# Patient Record
Sex: Female | Born: 1990 | Race: White | Hispanic: No | Smoking: Former smoker
Health system: Southern US, Community
[De-identification: ages and names within clinical notes are randomized; demographics above are authoritative.]

## PROBLEM LIST (undated history)

## (undated) DIAGNOSIS — F419 Anxiety disorder, unspecified: Secondary | ICD-10-CM

## (undated) DIAGNOSIS — F410 Panic disorder [episodic paroxysmal anxiety] without agoraphobia: Secondary | ICD-10-CM

## (undated) DIAGNOSIS — F4310 Post-traumatic stress disorder, unspecified: Secondary | ICD-10-CM

## (undated) DIAGNOSIS — T07XXXA Unspecified multiple injuries, initial encounter: Secondary | ICD-10-CM

## (undated) DIAGNOSIS — F319 Bipolar disorder, unspecified: Secondary | ICD-10-CM

## (undated) DIAGNOSIS — Z87442 Personal history of urinary calculi: Secondary | ICD-10-CM

## (undated) DIAGNOSIS — F6381 Intermittent explosive disorder: Secondary | ICD-10-CM

## (undated) HISTORY — PX: DILATION AND CURETTAGE OF UTERUS: SHX78

## (undated) HISTORY — PX: BREAST ENHANCEMENT SURGERY: SHX7

---
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
## 2009-06-08 ENCOUNTER — Emergency Department (HOSPITAL_COMMUNITY): Admission: EM | Admit: 2009-06-08 | Discharge: 2009-06-08 | Payer: Self-pay | Admitting: Emergency Medicine

## 2009-12-05 ENCOUNTER — Emergency Department (HOSPITAL_BASED_OUTPATIENT_CLINIC_OR_DEPARTMENT_OTHER): Admission: EM | Admit: 2009-12-05 | Discharge: 2009-12-06 | Payer: Self-pay | Admitting: Emergency Medicine

## 2009-12-06 ENCOUNTER — Ambulatory Visit: Payer: Self-pay | Admitting: Diagnostic Radiology

## 2010-01-13 ENCOUNTER — Emergency Department (HOSPITAL_COMMUNITY): Admission: EM | Admit: 2010-01-13 | Discharge: 2010-01-13 | Payer: Self-pay | Admitting: Emergency Medicine

## 2010-03-31 ENCOUNTER — Emergency Department (HOSPITAL_BASED_OUTPATIENT_CLINIC_OR_DEPARTMENT_OTHER): Admission: EM | Admit: 2010-03-31 | Discharge: 2010-03-31 | Payer: Self-pay | Admitting: Emergency Medicine

## 2010-03-31 ENCOUNTER — Ambulatory Visit: Payer: Self-pay | Admitting: Diagnostic Radiology

## 2010-06-06 ENCOUNTER — Emergency Department (HOSPITAL_BASED_OUTPATIENT_CLINIC_OR_DEPARTMENT_OTHER): Admission: EM | Admit: 2010-06-06 | Discharge: 2010-06-06 | Payer: Self-pay | Admitting: Emergency Medicine

## 2010-06-06 ENCOUNTER — Ambulatory Visit: Payer: Self-pay | Admitting: Diagnostic Radiology

## 2010-06-17 ENCOUNTER — Emergency Department (HOSPITAL_COMMUNITY)
Admission: EM | Admit: 2010-06-17 | Discharge: 2010-06-18 | Payer: Self-pay | Source: Home / Self Care | Admitting: Emergency Medicine

## 2010-07-26 ENCOUNTER — Emergency Department (HOSPITAL_COMMUNITY)
Admission: EM | Admit: 2010-07-26 | Discharge: 2010-07-26 | Payer: Self-pay | Source: Home / Self Care | Admitting: Emergency Medicine

## 2010-09-24 LAB — DIFFERENTIAL
Lymphocytes Relative: 39 % (ref 12–46)
Lymphs Abs: 2.3 10*3/uL (ref 0.7–4.0)
Monocytes Relative: 10 % (ref 3–12)
Neutro Abs: 3 10*3/uL (ref 1.7–7.7)
Neutrophils Relative %: 48 % (ref 43–77)

## 2010-09-24 LAB — BASIC METABOLIC PANEL
CO2: 27 mEq/L (ref 19–32)
Calcium: 9.5 mg/dL (ref 8.4–10.5)
GFR calc Af Amer: >60 mL/min (ref >60)
Sodium: 141 mEq/L (ref 135–145)

## 2010-09-24 LAB — CBC
Hemoglobin: 12 g/dL (ref 12.0–15.0)
MCH: 30.6 pg (ref 26.0–34.0)
RBC: 3.92 MIL/uL (ref 3.87–5.11)

## 2010-09-24 LAB — POCT CARDIAC MARKERS
CKMB, poc: <1 ng/mL — ABNORMAL LOW (ref 1.0–8.0)
Myoglobin, poc: 15.7 ng/mL (ref 12–200)
Troponin i, poc: <0.05 ng/mL (ref 0.00–0.09)

## 2010-09-27 LAB — POCT PREGNANCY, URINE: Preg Test, Ur: NEGATIVE

## 2010-09-28 LAB — BASIC METABOLIC PANEL
GFR calc non Af Amer: >60 mL/min (ref >60)
Potassium: 3.7 mEq/L (ref 3.5–5.1)
Sodium: 144 mEq/L (ref 135–145)

## 2010-09-28 LAB — POCT TOXICOLOGY PANEL: Tetrahydrocannabinol: POSITIVE

## 2010-09-28 LAB — PREGNANCY, URINE: Preg Test, Ur: NEGATIVE

## 2010-10-12 ENCOUNTER — Emergency Department (HOSPITAL_BASED_OUTPATIENT_CLINIC_OR_DEPARTMENT_OTHER)
Admission: EM | Admit: 2010-10-12 | Discharge: 2010-10-12 | Disposition: A | Discharge status: Home / Self Care | Payer: Medicaid Other | Attending: Emergency Medicine | Admitting: Emergency Medicine

## 2010-10-12 DIAGNOSIS — F341 Dysthymic disorder: Secondary | ICD-10-CM | POA: Insufficient documentation

## 2010-10-12 DIAGNOSIS — M79609 Pain in unspecified limb: Secondary | ICD-10-CM | POA: Insufficient documentation

## 2010-10-14 LAB — RAPID STREP SCREEN (MED CTR MEBANE ONLY): Streptococcus, Group A Screen (Direct): NEGATIVE

## 2010-11-21 ENCOUNTER — Emergency Department (HOSPITAL_BASED_OUTPATIENT_CLINIC_OR_DEPARTMENT_OTHER)
Admission: EM | Admit: 2010-11-21 | Discharge: 2010-11-21 | Disposition: A | Discharge status: Home / Self Care | Payer: Medicaid Other | Attending: Emergency Medicine | Admitting: Emergency Medicine

## 2010-11-21 DIAGNOSIS — J029 Acute pharyngitis, unspecified: Secondary | ICD-10-CM | POA: Insufficient documentation
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
## 2010-11-27 NOTE — Discharge Summary (Signed)
NAMEMarland Kitchen  Lydia Cochran, Lydia Cochran NO.:  [id number]   MEDICAL RECORD NO.:  [id number]         PATIENT TYPE:  BINP   LOCATION:  0302                          FACILITY:  [facility]   PHYSICIAN:  Jasmine Pang, M.D. DATE OF BIRTH:  07-10-1991   DATE OF ADMISSION:  12/12/2008  DATE OF DISCHARGE:  12/15/2008                               DISCHARGE SUMMARY   IDENTIFICATION:  This is an 20-1/21-year-old single white female, who  completed the 11th grade at [employer] and drop  down.   HISTORY OF PRESENT ILLNESS:  The patient was admitted emergently,  voluntarily from access and the intake crisis act [facility].  The patient reported a suicide plan, to cut herself, walk into  truck or in traffic, or shoot herself with father's gun.  She also  reported hating and wanting to harm everyone equally as a homicide  equivalent.  Her boyfriend had broken up with her the day before and  mother had evicted her 1 week before.  The patient is apparently  currently staying with her father.  The patient is due in court on December 18, 2008, being on probation for possession of paraphernalia and  cannabis.  She reports her last use of cannabis was 1 week ago.  Her  last alcohol was December 10, 2008.  For further information, see psychiatric  admission assessment.   PHYSICAL FINDINGS:  There were no acute physical or medical problems  noted.   HOSPITAL COURSE:  Upon admission, the patient was started on trazodone  50 mg p.o. q.h.s., p.r.n. insomnia may repeat x1, Seroquel 100 mg p.o.  x1  now, and Ativan 2 mg p.o. x1 due to severe agitation.  On December 13, 2008, trazodone was discontinued, Ativan was discontinued, with that she  was placed on Seroquel 25 mg p.o. q.4 h. p.r.n. agitation.  She was also  started on their 50 mg p.o. q.h.s.  On December 13, 2008, due to severe  agitation she was given Geodon 40 mg and Ativan 2 mg and restarted on  Ativan 1 mg to 2 mg p.o. q.4 h.,  p.r.n. anxiety or agitation.  She had  to be in an individual room due to her acting out behaviors.  There was  some anxiety and she could was started on Neurontin 300 mg q.4 hours,  p.r.n. anxiety.  She continued to be irritable and angry, when I met  with her, she was lying in bed with minimal eye contact.  Speech was  soft and slow.  There was positive psychomotor retardation.  She was  focused on wanting to go home.  Sleep was poor and appetite was poor.  As hospitalization progressed, mood improved somewhat.  On December 15, 2008,  she was asking for discharge.  Parents met with the counselor, who  agreed to the discharge.  The patient had refused her medications during  the hospitalization.  She was not suicidal or homicidal.  There was no  evidence of psychosis or thought disorder.  It was felt the patient was  safe to go home today. In  her family session, she said she would pursue  counseling and possibly 12-step meetings.  She will also have followup  at [facility].  On January 07, 2009, the patient left after the family  session with her parents and patient affirmed understanding of the  followup plan.   DISCHARGE DIAGNOSES:  Axis I:  Mood disorder, not otherwise specified.  Generalized anxiety disorder, oppositional defiant disorder, cannabis  dependence, alcohol abuse.  Axis II:  None.  Axis III:  Chronic bronchitis.  Axis IV:  Stressors severe (family extreme acute and chronic, legal  moderate acute and chronic, schools severe, acute and chronic phase of  life, severe, acute and chronic, peer relations, severe, acute and  chronic).  Axis V:  Global assessment of functioning was 45 upon discharge.  GAF  was 20 upon admission.  GAF highest past year was 55.   DISCHARGE PLANS:  There was no specific activity level or dietary  restrictions.   POSTHOSPITAL CARE PLANS:  The patient will go to [facility] on January 07, 2009, at 11 a.m.  She will also go to [facility]  for  counseling.   DISCHARGE MEDICATIONS:  None.  The patient refused to take any of the  medications offered to her in the hospital and did not want to take any  medications at home.      Jasmine Pang, M.D.  Electronically Signed     BHS/MEDQ  D:  12/24/2008  T:  12/25/2008  Job:  [id number]

## 2011-06-17 ENCOUNTER — Encounter: Payer: Self-pay | Admitting: Emergency Medicine

## 2011-06-17 ENCOUNTER — Emergency Department (HOSPITAL_COMMUNITY)
Admission: EM | Admit: 2011-06-17 | Discharge: 2011-06-17 | Disposition: A | Discharge status: Home / Self Care | Payer: Self-pay | Attending: Emergency Medicine | Admitting: Emergency Medicine

## 2011-06-17 DIAGNOSIS — F6381 Intermittent explosive disorder: Secondary | ICD-10-CM | POA: Insufficient documentation

## 2011-06-17 DIAGNOSIS — Z79899 Other long term (current) drug therapy: Secondary | ICD-10-CM | POA: Insufficient documentation

## 2011-06-17 DIAGNOSIS — Z760 Encounter for issue of repeat prescription: Secondary | ICD-10-CM | POA: Insufficient documentation

## 2011-06-17 DIAGNOSIS — F410 Panic disorder [episodic paroxysmal anxiety] without agoraphobia: Secondary | ICD-10-CM | POA: Insufficient documentation

## 2011-06-17 DIAGNOSIS — F419 Anxiety disorder, unspecified: Secondary | ICD-10-CM | POA: Insufficient documentation

## 2011-06-17 HISTORY — DX: Intermittent explosive disorder: F63.81

## 2011-06-17 HISTORY — DX: Panic disorder (episodic paroxysmal anxiety): F41.0

## 2011-06-17 HISTORY — DX: Anxiety disorder, unspecified: F41.9

## 2011-06-17 MED ORDER — DIVALPROEX SODIUM ER 500 MG PO TB24: 500.0000 mg | ORAL_TABLET | Freq: Every day | ORAL | Status: DC

## 2011-06-17 MED ORDER — PROMETHAZINE HCL 25 MG PO TABS: 25.0000 mg | ORAL_TABLET | Freq: Four times a day (QID) | ORAL | Status: DC | PRN

## 2011-06-17 MED ORDER — CHLORPROMAZINE HCL 10 MG PO TABS: 50.0000 mg | ORAL_TABLET | Freq: Every day | ORAL | Status: DC

## 2011-06-17 NOTE — ED Notes (Signed)
Pt sts she was in jail for 4 months receiving her meds and now has been let out and has been without them for several days; pt sts feels like she is having withdrawal and is more agitated; pt sts call [facility] and had appt set up for 12/28 but were not able to help with meds

## 2011-06-17 NOTE — ED Provider Notes (Signed)
History     CSN: [id number] Arrival date & time: 06/17/2011 12:36 PM   First MD Initiated Contact with Patient 06/17/11 1301      Chief Complaint  Patient presents with  . Medication Refill    (Consider location/radiation/quality/duration/timing/severity/associated sxs/prior treatment) The history is provided by the patient.   patient is a 20 year old female recently released from jail and needs refill of her behavioral health metastases she went to the [facility] they were unable to read new the meds but did make an appointment for her on December 28 to see a psychiatrist who can renew her meds. Patient currently not having any acute behavioral health problem.   Past Medical History  Diagnosis Date  . Intermittent explosive disorder   . Anxiety   . Panic attack     History reviewed. No pertinent past surgical history.  History reviewed. No pertinent family history.  History  Substance Use Topics  . Smoking status: Current Everyday Smoker  . Smokeless tobacco: Not on file  . Alcohol Use: No    OB History    Grav Para Term Preterm Abortions TAB SAB Ect Mult Living                  Review of Systems  Constitutional: Negative for fever and diaphoresis.  HENT: Negative for congestion and neck pain.   Eyes: Negative for visual disturbance.  Respiratory: Negative for cough and shortness of breath.   Cardiovascular: Negative for chest pain.  Gastrointestinal: Negative for nausea, vomiting, abdominal pain and diarrhea.  Genitourinary: Negative for dysuria.  Musculoskeletal: Negative for back pain.  Neurological: Negative for headaches.    Allergies  Review of patient's allergies indicates no known allergies.  Home Medications   Current Outpatient Rx  Name Route Sig Dispense Refill  . ALPRAZOLAM 1 MG PO TABS Oral Take 1 mg by mouth 3 (three) times daily.      . CHLORPROMAZINE HCL 50 MG PO TABS Oral Take 50 mg by mouth 2 (two) times daily.      Marland Kitchen DIVALPROEX  SODIUM 500 MG PO TBEC Oral Take 1,000 mg by mouth 2 (two) times daily.      Marland Kitchen PROMETHAZINE HCL 25 MG PO TABS Oral Take 25 mg by mouth every 6 (six) hours as needed. For nausea     . CHLORPROMAZINE HCL 10 MG PO TABS Oral Take 5 tablets (50 mg total) by mouth daily. 30 tablet 0  . DIVALPROEX SODIUM ER 500 MG PO TB24 Oral Take 1 tablet (500 mg total) by mouth daily. 30 tablet 0  . PROMETHAZINE HCL 25 MG PO TABS Oral Take 1 tablet (25 mg total) by mouth every 6 (six) hours as needed for nausea. 20 tablet 0    BP 94/57  Pulse 72  Temp(Src) 97.8 F (36.6 C) (Oral)  Resp 20  SpO2 100%  Physical Exam  Nursing note and vitals reviewed. Constitutional: She is oriented to person, place, and time. She appears well-developed and well-nourished. No distress.  HENT:  Head: Normocephalic and atraumatic.  Mouth/Throat: Oropharynx is clear and moist.  Eyes: Conjunctivae and EOM are normal. Pupils are equal, round, and reactive to light.  Neck: Normal range of motion. Neck supple.  Cardiovascular: Normal rate, regular rhythm and normal heart sounds.   Pulmonary/Chest: Effort normal and breath sounds normal.  Abdominal: Soft. Bowel sounds are normal.  Musculoskeletal: Normal range of motion.  Neurological: She is alert and oriented to person, place, and time. No cranial nerve  deficit. She exhibits normal muscle tone. Coordination normal.  Skin: Skin is warm. No rash noted.    ED Course  Procedures (including critical care time)  Labs Reviewed - No data to display No results found.   1. Medication refill       MDM   The patient with history of intermittent explosive disorder anxiety and panic attacks presents to the emergency department for refill of her behavioral health medication. She is oriented and seen at the walk-in [facility], and given an appointment on December 28 to see the psychiatrist there for further medication renewals. We will renew her medications today with the  exception of the Xanax. Renewed her Thorazine Depakote and Phenergan. Patient has been sleeping on the ED bed since arrival and had to be awoken for the exam, she is showing no signs of anxiety or significant withdrawals.        Shelda Jakes, MD 06/17/11 1415

## 2012-07-25 ENCOUNTER — Encounter (HOSPITAL_COMMUNITY): Payer: Self-pay | Admitting: *Deleted

## 2012-07-25 ENCOUNTER — Emergency Department (HOSPITAL_COMMUNITY)
Admission: EM | Admit: 2012-07-25 | Discharge: 2012-07-25 | Discharge status: Against Medical Advice | Payer: Self-pay | Attending: Emergency Medicine | Admitting: Emergency Medicine

## 2012-07-25 DIAGNOSIS — R109 Unspecified abdominal pain: Secondary | ICD-10-CM | POA: Insufficient documentation

## 2012-07-25 NOTE — ED Notes (Signed)
Pt did not answer x's 3 when called for blood draw.

## 2012-07-25 NOTE — ED Notes (Signed)
Pt states started having L flank pain today, family hx of kidney stones, denies urinary symptoms, states is on her menstrual cycle but this is not like cramps.

## 2012-07-25 NOTE — ED Notes (Signed)
Did not answer when called to room.

## 2012-07-26 ENCOUNTER — Emergency Department (HOSPITAL_COMMUNITY)
Admission: EM | Admit: 2012-07-26 | Discharge: 2012-07-26 | Disposition: A | Discharge status: Home / Self Care | Payer: Self-pay | Attending: Emergency Medicine | Admitting: Emergency Medicine

## 2012-07-26 ENCOUNTER — Encounter (HOSPITAL_COMMUNITY): Payer: Self-pay | Admitting: Emergency Medicine

## 2012-07-26 ENCOUNTER — Emergency Department (HOSPITAL_COMMUNITY): Payer: Self-pay

## 2012-07-26 DIAGNOSIS — F17200 Nicotine dependence, unspecified, uncomplicated: Secondary | ICD-10-CM | POA: Insufficient documentation

## 2012-07-26 DIAGNOSIS — F411 Generalized anxiety disorder: Secondary | ICD-10-CM | POA: Insufficient documentation

## 2012-07-26 DIAGNOSIS — Z3202 Encounter for pregnancy test, result negative: Secondary | ICD-10-CM | POA: Insufficient documentation

## 2012-07-26 DIAGNOSIS — F6381 Intermittent explosive disorder: Secondary | ICD-10-CM | POA: Insufficient documentation

## 2012-07-26 DIAGNOSIS — Z8742 Personal history of other diseases of the female genital tract: Secondary | ICD-10-CM | POA: Insufficient documentation

## 2012-07-26 DIAGNOSIS — R112 Nausea with vomiting, unspecified: Secondary | ICD-10-CM | POA: Insufficient documentation

## 2012-07-26 DIAGNOSIS — R109 Unspecified abdominal pain: Secondary | ICD-10-CM | POA: Insufficient documentation

## 2012-07-26 LAB — CBC WITH DIFFERENTIAL/PLATELET
Basophils Absolute: 0 10*3/uL (ref 0.0–0.1)
Basophils Relative: 0 % (ref 0–1)
Eosinophils Absolute: 0 10*3/uL (ref 0.0–0.7)
Eosinophils Relative: 0 % (ref 0–5)
HCT: 36.6 % (ref 36.0–46.0)
Hemoglobin: 12.7 g/dL (ref 12.0–15.0)
Lymphocytes Relative: 5 % — ABNORMAL LOW (ref 12–46)
Lymphs Abs: 0.8 10*3/uL (ref 0.7–4.0)
MCH: 28.6 pg (ref 26.0–34.0)
MCHC: 34.7 g/dL (ref 30.0–36.0)
MCV: 82.4 fL (ref 78.0–100.0)
Monocytes Absolute: 1.6 10*3/uL — ABNORMAL HIGH (ref 0.1–1.0)
Monocytes Relative: 9 % (ref 3–12)
Neutro Abs: 14.7 10*3/uL — ABNORMAL HIGH (ref 1.7–7.7)
Neutrophils Relative %: 86 % — ABNORMAL HIGH (ref 43–77)
Platelets: 191 10*3/uL (ref 150–400)
RBC: 4.44 MIL/uL (ref 3.87–5.11)
RDW: 14.3 % (ref 11.5–15.5)
WBC: 17.1 10*3/uL — ABNORMAL HIGH (ref 4.0–10.5)

## 2012-07-26 LAB — URINALYSIS, MICROSCOPIC ONLY
Bilirubin Urine: NEGATIVE
Glucose, UA: NEGATIVE mg/dL
Ketones, ur: 15 mg/dL — AB
Leukocytes, UA: NEGATIVE
Nitrite: NEGATIVE
Protein, ur: 30 mg/dL — AB
Specific Gravity, Urine: 1.027 (ref 1.005–1.030)
Urobilinogen, UA: 1 mg/dL (ref 0.0–1.0)
pH: 6 (ref 5.0–8.0)

## 2012-07-26 LAB — COMPREHENSIVE METABOLIC PANEL
ALT: 11 U/L (ref 0–35)
AST: 15 U/L (ref 0–37)
Albumin: 4.1 g/dL (ref 3.5–5.2)
Alkaline Phosphatase: 65 U/L (ref 39–117)
BUN: 7 mg/dL (ref 6–23)
CO2: 26 mEq/L (ref 19–32)
Calcium: 9.4 mg/dL (ref 8.4–10.5)
Chloride: 92 mEq/L — ABNORMAL LOW (ref 96–112)
Creatinine, Ser: 0.49 mg/dL — ABNORMAL LOW (ref 0.50–1.10)
GFR calc Af Amer: >90 mL/min (ref >90)
GFR calc non Af Amer: >90 mL/min (ref >90)
Glucose, Bld: 117 mg/dL — ABNORMAL HIGH (ref 70–99)
Potassium: 3 mEq/L — ABNORMAL LOW (ref 3.5–5.1)
Sodium: 132 mEq/L — ABNORMAL LOW (ref 135–145)
Total Bilirubin: 0.5 mg/dL (ref 0.3–1.2)
Total Protein: 7.8 g/dL (ref 6.0–8.3)

## 2012-07-26 LAB — POCT PREGNANCY, URINE: Preg Test, Ur: NEGATIVE

## 2012-07-26 LAB — LIPASE, BLOOD: Lipase: 7 U/L — ABNORMAL LOW (ref 11–59)

## 2012-07-26 MED ORDER — SODIUM CHLORIDE 0.9 % IV SOLN
1000.0000 mL | Freq: Once | INTRAVENOUS | Status: AC
  Administered 2012-07-26: 1000 mL via INTRAVENOUS

## 2012-07-26 MED ORDER — POTASSIUM CHLORIDE CRYS ER 20 MEQ PO TBCR
60.0000 meq | EXTENDED_RELEASE_TABLET | Freq: Once | ORAL | Status: AC
  Administered 2012-07-26: 60 meq via ORAL
  Filled 2012-07-26: qty 3

## 2012-07-26 MED ORDER — HYDROMORPHONE HCL PF 1 MG/ML IJ SOLN
1.0000 mg | Freq: Once | INTRAMUSCULAR | Status: AC
  Administered 2012-07-26: 1 mg via INTRAVENOUS
  Filled 2012-07-26: qty 1

## 2012-07-26 MED ORDER — OXYCODONE-ACETAMINOPHEN 5-325 MG PO TABS: 1.0000 | ORAL_TABLET | ORAL | Status: DC | PRN

## 2012-07-26 MED ORDER — ONDANSETRON HCL 4 MG PO TABS: 4.0000 mg | ORAL_TABLET | Freq: Four times a day (QID) | ORAL | Status: DC

## 2012-07-26 MED ORDER — POTASSIUM CHLORIDE CRYS ER 20 MEQ PO TBCR: 40.0000 meq | EXTENDED_RELEASE_TABLET | Freq: Once | ORAL | Status: DC

## 2012-07-26 MED ORDER — KETOROLAC TROMETHAMINE 15 MG/ML IJ SOLN
15.0000 mg | Freq: Once | INTRAMUSCULAR | Status: AC
  Administered 2012-07-26: 15 mg via INTRAVENOUS
  Filled 2012-07-26 (×2): qty 1

## 2012-07-26 MED ORDER — ONDANSETRON HCL 4 MG/2ML IJ SOLN: 4.0000 mg | Freq: Once | INTRAMUSCULAR | Status: DC

## 2012-07-26 MED ORDER — ONDANSETRON HCL 4 MG/2ML IJ SOLN
4.0000 mg | Freq: Once | INTRAMUSCULAR | Status: AC
  Administered 2012-07-26: 4 mg via INTRAVENOUS
  Filled 2012-07-26: qty 2

## 2012-07-26 NOTE — ED Notes (Signed)
Pt presenting to ed with c/o abdominal pain with positive nausea and vomiting pt denies diarrhea at this time

## 2012-07-26 NOTE — ED Notes (Signed)
Patient transported to CT 

## 2012-07-26 NOTE — ED Notes (Signed)
Pt escorted to discharge window. Verbalized understanding discharge instructions. In no acute distress.   

## 2012-07-26 NOTE — ED Provider Notes (Signed)
History    22 year old female with left flank pain. Onset yesterday. Atraumatic. Pain is the left flank it does not radiate. Constant without appreciable exacerbating relieving factors. No fever or chills. Nausea and has, the pain is severe. No urinary complaints. No unusual vaginal bleeding or discharge. She just finished her period. She does not think she is pregnant. No history of similar pain. She says she has had ovarian cyst previously this pain feels different and is higher than it was at that time. No history of kidney stones. No diarrhea. No sick contacts.   CSN: [id number]  Arrival date & time 07/26/12  1238   First MD Initiated Contact with Patient 07/26/12 1254      Chief Complaint  Patient presents with  . Abdominal Pain  . Nausea  . Emesis    (Consider location/radiation/quality/duration/timing/severity/associated sxs/prior treatment) HPI  Past Medical History  Diagnosis Date  . Intermittent explosive disorder   . Anxiety   . Panic attack     History reviewed. No pertinent past surgical history.  No family history on file.  History  Substance Use Topics  . Smoking status: Current Every Day Smoker  . Smokeless tobacco: Not on file  . Alcohol Use: No    OB History    Grav Para Term Preterm Abortions TAB SAB Ect Mult Living                  Review of Systems  All systems reviewed and negative, other than as noted in HPI.   Allergies  Morphine and related; Darvocet; and Tramadol  Home Medications  No current outpatient prescriptions on file.  BP 105/62  Pulse 63  Temp 98.4 F (36.9 C) (Oral)  Resp 17  SpO2 98%  LMP 07/18/2012  Physical Exam  Nursing note and vitals reviewed. Constitutional: She appears well-developed and well-nourished. No distress.       Laying in bed. Mildly uncomfortable appearing.  HENT:  Head: Normocephalic and atraumatic.  Eyes: Conjunctivae normal are normal. Right eye exhibits no discharge. Left eye exhibits no  discharge.  Neck: Neck supple.  Cardiovascular: Normal rate, regular rhythm and normal heart sounds.  Exam reveals no gallop and no friction rub.   No murmur heard. Pulmonary/Chest: Effort normal and breath sounds normal. No respiratory distress.  Abdominal: Soft. She exhibits no distension and no mass. There is no tenderness. There is no rebound.  Genitourinary:       No costovertebral angle tenderness  Musculoskeletal: She exhibits no edema and no tenderness.  Neurological: She is alert.  Skin: Skin is warm and dry.  Psychiatric: She has a normal mood and affect. Her behavior is normal. Thought content normal.    ED Course  Procedures (including critical care time)  Labs Reviewed  CBC WITH DIFFERENTIAL - Abnormal; Notable for the following:    WBC 17.1 (*)     Neutrophils Relative 86 (*)     Neutro Abs 14.7 (*)     Lymphocytes Relative 5 (*)     Monocytes Absolute 1.6 (*)     All other components within normal limits  COMPREHENSIVE METABOLIC PANEL - Abnormal; Notable for the following:    Sodium 132 (*)     Potassium 3.0 (*)     Chloride 92 (*)     Glucose, Bld 117 (*)     Creatinine, Ser 0.49 (*)     All other components within normal limits  LIPASE, BLOOD - Abnormal; Notable for the following:  Lipase 7 (*)     All other components within normal limits  URINALYSIS, MICROSCOPIC ONLY - Abnormal; Notable for the following:    Color, Urine AMBER (*)  BIOCHEMICALS MAY BE AFFECTED BY COLOR   APPearance CLOUDY (*)     Hgb urine dipstick SMALL (*)     Ketones, ur 15 (*)     Protein, ur 30 (*)     Bacteria, UA FEW (*)     Squamous Epithelial / LPF FEW (*)     All other components within normal limits  POCT PREGNANCY, URINE   No results found.   1. Left flank pain   2. Nausea and vomiting       MDM  22 year old female with abdominal pain. She is a relatively benign abdominal exam. CT was done to evaluate for possible ureteral colic and pain in her left flank. Her  CT was unremarkable. Her pain may be related to atelectasis or vomiting. She has not had any vomiting here in the emergency room. She tolerated several large potassium pills without any problems. She's afebrile and hemodynamically stable. Very low suspicion for emergent etiology. I feel that she is safe for discharge at this time. Will continue to treat symptomatically. Emergent return cautions were discussed.        Raeford Razor, MD 07/26/12 [phone]

## 2012-11-16 ENCOUNTER — Encounter (HOSPITAL_COMMUNITY): Payer: Self-pay | Admitting: Emergency Medicine

## 2012-11-16 ENCOUNTER — Emergency Department (HOSPITAL_COMMUNITY): Payer: Self-pay

## 2012-11-16 ENCOUNTER — Inpatient Hospital Stay (HOSPITAL_COMMUNITY): Admission: EM | Admit: 2012-11-16 | Payer: 59 | Source: Intra-hospital | Admitting: Psychiatry

## 2012-11-16 ENCOUNTER — Emergency Department (HOSPITAL_COMMUNITY)
Admission: EM | Admit: 2012-11-16 | Discharge: 2012-11-17 | Disposition: A | Discharge status: Home / Self Care | Payer: Self-pay | Attending: Emergency Medicine | Admitting: Emergency Medicine

## 2012-11-16 DIAGNOSIS — Z008 Encounter for other general examination: Secondary | ICD-10-CM | POA: Insufficient documentation

## 2012-11-16 DIAGNOSIS — S71109A Unspecified open wound, unspecified thigh, initial encounter: Secondary | ICD-10-CM | POA: Insufficient documentation

## 2012-11-16 DIAGNOSIS — F609 Personality disorder, unspecified: Secondary | ICD-10-CM

## 2012-11-16 DIAGNOSIS — F419 Anxiety disorder, unspecified: Secondary | ICD-10-CM

## 2012-11-16 DIAGNOSIS — F6381 Intermittent explosive disorder: Secondary | ICD-10-CM

## 2012-11-16 DIAGNOSIS — F17200 Nicotine dependence, unspecified, uncomplicated: Secondary | ICD-10-CM | POA: Insufficient documentation

## 2012-11-16 DIAGNOSIS — Z8659 Personal history of other mental and behavioral disorders: Secondary | ICD-10-CM | POA: Insufficient documentation

## 2012-11-16 DIAGNOSIS — F339 Major depressive disorder, recurrent, unspecified: Secondary | ICD-10-CM

## 2012-11-16 DIAGNOSIS — F1210 Cannabis abuse, uncomplicated: Secondary | ICD-10-CM

## 2012-11-16 DIAGNOSIS — S71009A Unspecified open wound, unspecified hip, initial encounter: Secondary | ICD-10-CM | POA: Insufficient documentation

## 2012-11-16 DIAGNOSIS — F411 Generalized anxiety disorder: Secondary | ICD-10-CM | POA: Insufficient documentation

## 2012-11-16 DIAGNOSIS — F329 Major depressive disorder, single episode, unspecified: Secondary | ICD-10-CM

## 2012-11-16 LAB — BASIC METABOLIC PANEL
CO2: 24 mEq/L (ref 19–32)
Calcium: 9.5 mg/dL (ref 8.4–10.5)
Creatinine, Ser: 0.48 mg/dL — ABNORMAL LOW (ref 0.50–1.10)
GFR calc non Af Amer: >90 mL/min (ref >90)
Glucose, Bld: 95 mg/dL (ref 70–99)

## 2012-11-16 LAB — CBC WITH DIFFERENTIAL/PLATELET
Basophils Relative: 1 % (ref 0–1)
MCH: 28.8 pg (ref 26.0–34.0)
MCV: 84.7 fL (ref 78.0–100.0)
Monocytes Absolute: 0.5 10*3/uL (ref 0.1–1.0)
Platelets: 253 10*3/uL (ref 150–400)
RDW: 12.7 % (ref 11.5–15.5)
WBC: 4.4 10*3/uL (ref 4.0–10.5)

## 2012-11-16 LAB — PREGNANCY, URINE: Preg Test, Ur: NEGATIVE

## 2012-11-16 LAB — HEPATIC FUNCTION PANEL
ALT: 7 U/L (ref 0–35)
AST: 16 U/L (ref 0–37)
Albumin: 4.3 g/dL (ref 3.5–5.2)
Alkaline Phosphatase: 57 U/L (ref 39–117)
Bilirubin, Direct: <0.1 mg/dL (ref 0.0–0.3)
Total Bilirubin: 0.2 mg/dL — ABNORMAL LOW (ref 0.3–1.2)

## 2012-11-16 LAB — SALICYLATE LEVEL: Salicylate Lvl: <2 mg/dL — ABNORMAL LOW (ref 2.8–20.0)

## 2012-11-16 LAB — RAPID URINE DRUG SCREEN, HOSP PERFORMED
Amphetamines: NOT DETECTED
Benzodiazepines: NOT DETECTED
Tetrahydrocannabinol: POSITIVE — AB

## 2012-11-16 LAB — URINALYSIS, ROUTINE W REFLEX MICROSCOPIC
Bilirubin Urine: NEGATIVE
Glucose, UA: NEGATIVE mg/dL
Hgb urine dipstick: NEGATIVE
Protein, ur: NEGATIVE mg/dL
Urobilinogen, UA: 0.2 mg/dL (ref 0.0–1.0)

## 2012-11-16 LAB — ACETAMINOPHEN LEVEL: Acetaminophen (Tylenol), Serum: <15 ug/mL (ref 10–30)

## 2012-11-16 MED ORDER — SULFAMETHOXAZOLE-TMP DS 800-160 MG PO TABS
2.0000 | ORAL_TABLET | Freq: Two times a day (BID) | ORAL | Status: DC
  Administered 2012-11-16 – 2012-11-17 (×3): 2 via ORAL
  Filled 2012-11-16 (×3): qty 2

## 2012-11-16 MED ORDER — ZOLPIDEM TARTRATE 5 MG PO TABS: 5.0000 mg | ORAL_TABLET | Freq: Every evening | ORAL | Status: DC | PRN

## 2012-11-16 MED ORDER — ONDANSETRON HCL 4 MG PO TABS
4.0000 mg | ORAL_TABLET | Freq: Three times a day (TID) | ORAL | Status: DC | PRN
  Administered 2012-11-16: 4 mg via ORAL
  Filled 2012-11-16: qty 1

## 2012-11-16 MED ORDER — CHLORPROMAZINE HCL 25 MG PO TABS
50.0000 mg | ORAL_TABLET | Freq: Two times a day (BID) | ORAL | Status: DC
  Administered 2012-11-16: 50 mg via ORAL
  Filled 2012-11-16: qty 2

## 2012-11-16 MED ORDER — FLUOXETINE HCL 10 MG PO CAPS
10.0000 mg | ORAL_CAPSULE | Freq: Every day | ORAL | Status: DC
  Filled 2012-11-16 (×2): qty 1

## 2012-11-16 MED ORDER — POTASSIUM CHLORIDE CRYS ER 20 MEQ PO TBCR
40.0000 meq | EXTENDED_RELEASE_TABLET | Freq: Once | ORAL | Status: AC
  Administered 2012-11-16: 40 meq via ORAL
  Filled 2012-11-16 (×2): qty 1

## 2012-11-16 MED ORDER — ONDANSETRON 4 MG PO TBDP
4.0000 mg | ORAL_TABLET | Freq: Three times a day (TID) | ORAL | Status: DC | PRN
  Administered 2012-11-16: 4 mg via ORAL
  Filled 2012-11-16: qty 1

## 2012-11-16 MED ORDER — ZIPRASIDONE MESYLATE 20 MG IM SOLR
10.0000 mg | INTRAMUSCULAR | Status: DC | PRN
  Administered 2012-11-16: 10 mg via INTRAMUSCULAR
  Filled 2012-11-16: qty 20

## 2012-11-16 MED ORDER — CEPHALEXIN 500 MG PO CAPS
500.0000 mg | ORAL_CAPSULE | Freq: Four times a day (QID) | ORAL | Status: DC
  Administered 2012-11-16 – 2012-11-17 (×5): 500 mg via ORAL
  Filled 2012-11-16 (×5): qty 1

## 2012-11-16 MED ORDER — IBUPROFEN 600 MG PO TABS: 600.0000 mg | ORAL_TABLET | Freq: Three times a day (TID) | ORAL | Status: DC | PRN

## 2012-11-16 MED ORDER — NICOTINE 21 MG/24HR TD PT24
21.0000 mg | MEDICATED_PATCH | Freq: Every day | TRANSDERMAL | Status: DC
  Administered 2012-11-16 – 2012-11-17 (×2): 21 mg via TRANSDERMAL
  Filled 2012-11-16 (×2): qty 1

## 2012-11-16 MED ORDER — TETANUS-DIPHTH-ACELL PERTUSSIS 5-2.5-18.5 LF-MCG/0.5 IM SUSP
0.5000 mL | Freq: Once | INTRAMUSCULAR | Status: AC
  Administered 2012-11-16: 0.5 mL via INTRAMUSCULAR
  Filled 2012-11-16: qty 0.5

## 2012-11-16 MED ORDER — LORAZEPAM 1 MG PO TABS
1.0000 mg | ORAL_TABLET | Freq: Three times a day (TID) | ORAL | Status: DC | PRN
  Administered 2012-11-16 – 2012-11-17 (×2): 1 mg via ORAL
  Filled 2012-11-16 (×2): qty 1

## 2012-11-16 MED ORDER — DIVALPROEX SODIUM 500 MG PO DR TAB
500.0000 mg | DELAYED_RELEASE_TABLET | Freq: Every day | ORAL | Status: DC
  Administered 2012-11-16 – 2012-11-17 (×2): 500 mg via ORAL
  Filled 2012-11-16 (×2): qty 1

## 2012-11-16 MED ORDER — HYDROXYZINE HCL 25 MG PO TABS: 50.0000 mg | ORAL_TABLET | Freq: Every evening | ORAL | Status: DC | PRN

## 2012-11-16 MED ORDER — HYDROCODONE-ACETAMINOPHEN 5-325 MG PO TABS
1.0000 | ORAL_TABLET | Freq: Three times a day (TID) | ORAL | Status: DC | PRN
  Administered 2012-11-16 – 2012-11-17 (×2): 1 via ORAL
  Filled 2012-11-16 (×2): qty 1

## 2012-11-16 NOTE — ED Notes (Signed)
nausea resolved.  Pt has been able to eat some of her dinner

## 2012-11-16 NOTE — ED Notes (Signed)
Up to the desk on the phone 

## 2012-11-16 NOTE — ED Notes (Signed)
Up to the desk c/o nausea

## 2012-11-16 NOTE — ED Notes (Signed)
Pt is vomiting. She said she vomited right after she was given Zofran at about 1800 and it is documented that occurred. Pt currently asking for something for nausea/vomiting. Tried to call EDP, waiting for him to call back.

## 2012-11-16 NOTE — ED Notes (Signed)
telepsych info called and faxed
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
## 2012-11-16 NOTE — ED Notes (Signed)
Patient refused meds

## 2012-11-16 NOTE — ED Notes (Signed)
Dr Elsie Saas into see, Pt reports she has not taken her thorazine since march and she thinks it has made her sick.

## 2012-11-16 NOTE — ED Notes (Signed)
Lydia Cochran--pt's father--[phone]
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
## 2012-11-16 NOTE — ED Notes (Signed)
Too drowsy to swallow oral medication.

## 2012-11-16 NOTE — ED Notes (Signed)
Attempted to call report to psych ED and was instructed they could not accept the patient since she has an ace wrap on her right arm.

## 2012-11-16 NOTE — ED Notes (Signed)
Pt vomited after taking the zofran
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
## 2012-11-16 NOTE — ED Notes (Signed)
Patient remains lethargic, but easily awakens with verbal stimuli. Psych physician in to see patient and spoke with patient's father.
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
## 2012-11-16 NOTE — ED Notes (Signed)
ACT TEam with patient.

## 2012-11-17 DIAGNOSIS — F4325 Adjustment disorder with mixed disturbance of emotions and conduct: Secondary | ICD-10-CM

## 2012-11-17 DIAGNOSIS — F3160 Bipolar disorder, current episode mixed, unspecified: Secondary | ICD-10-CM

## 2012-11-17 MED ORDER — DIVALPROEX SODIUM 500 MG PO DR TAB: 500.0000 mg | DELAYED_RELEASE_TABLET | Freq: Two times a day (BID) | ORAL | Status: DC

## 2012-11-17 MED ORDER — IBUPROFEN 600 MG PO TABS: 600.0000 mg | ORAL_TABLET | Freq: Three times a day (TID) | ORAL | Status: DC | PRN

## 2012-11-17 MED ORDER — SULFAMETHOXAZOLE-TMP DS 800-160 MG PO TABS: 2.0000 | ORAL_TABLET | Freq: Two times a day (BID) | ORAL | Status: DC

## 2012-11-17 MED ORDER — FLUOXETINE HCL 10 MG PO CAPS: 10.0000 mg | ORAL_CAPSULE | Freq: Every day | ORAL | Status: DC

## 2012-11-17 MED ORDER — CEPHALEXIN 500 MG PO CAPS: 500.0000 mg | ORAL_CAPSULE | Freq: Four times a day (QID) | ORAL | Status: DC

## 2012-11-17 NOTE — Consult Note (Signed)
Reason for Consult: Bipolar disorder most recent episode is mixed Referring Physician:  [name] is an 21 y.o. female.  HPI: Patient has been stable on her current medication management. Patient has slept very well, patient has been without a significant behavioral or emotional problems over 24 hours. Patient has been eating well and sleeping well. Patient requested to be discharged to home and her father has been supportive to her and willing to follow up with outpatient psychiatric services as recommended. Patient is willing to take her medication as prescribed. Patient index screen was positive for tetrahydrocannabinol.  Mental Status Examination: Patient appeared as per his stated age, and fairly groomed, and maintaining good eye contact. Patient has good mood and his affect was constricted. He has normal rate, rhythm, and volume of speech. His thought process is linear and goal directed. Patient has denied suicidal, homicidal ideations, intentions or plans. Patient has no evidence of auditory or visual hallucinations, delusions, and paranoia. Patient has poor insight judgment and impulse control.  Past Medical History  Diagnosis Date  . Intermittent explosive disorder   . Anxiety   . Panic attack     History reviewed. No pertinent past surgical history.  History reviewed. No pertinent family history.  Social History:  reports that she has been smoking.  She does not have any smokeless tobacco history on file. She reports that she does not drink alcohol or use illicit drugs.  Allergies:  Allergies  Allergen Reactions  . Morphine And Related Hives  . Darvocet (Propoxyphene-Acetaminophen) Itching and Nausea And Vomiting  . Tape     Plastic tape gives her a reaction. Paper tape must be used.   . Tramadol Nausea And Vomiting    Medications: I have reviewed the patient's current medications.  Results for orders placed during the hospital encounter of 11/16/12 (from  the past 48 hour(s))  BASIC METABOLIC PANEL     Status: Abnormal   Collection Time    11/16/12  8:45 AM      Result Value Range   Sodium 142  135 - 145 mEq/L   Potassium 3.2 (*) 3.5 - 5.1 mEq/L   Chloride 107  96 - 112 mEq/L   CO2 24  19 - 32 mEq/L   Glucose, Bld 95  70 - 99 mg/dL   BUN 6  6 - 23 mg/dL   Creatinine, Ser 9.60 (*) 0.50 - 1.10 mg/dL   Calcium 9.5  8.4 - 45.4 mg/dL   GFR calc non Af Amer >90  >90 mL/min   GFR calc Af Amer >90  >90 mL/min   Comment:            The eGFR has been calculated     using the CKD EPI equation.     This calculation has not been     validated in all clinical     situations.     eGFR's persistently     <90 mL/min signify     possible Chronic Kidney Disease.  CBC WITH DIFFERENTIAL     Status: Abnormal   Collection Time    11/16/12  8:45 AM      Result Value Range   WBC 4.4  4.0 - 10.5 K/uL   RBC 4.13  3.87 - 5.11 MIL/uL   Hemoglobin 11.9 (*) 12.0 - 15.0 g/dL   HCT 09.8 (*) 11.9 - 14.7 %   MCV 84.7  78.0 - 100.0 fL   MCH 28.8  26.0 -  34.0 pg   MCHC 34.0  30.0 - 36.0 g/dL   RDW 29.5  28.4 - 13.2 %   Platelets 253  150 - 400 K/uL   Neutrophils Relative 44  43 - 77 %   Neutro Abs 1.9  1.7 - 7.7 K/uL   Lymphocytes Relative 41  12 - 46 %   Lymphs Abs 1.8  0.7 - 4.0 K/uL   Monocytes Relative 12  3 - 12 %   Monocytes Absolute 0.5  0.1 - 1.0 K/uL   Eosinophils Relative 2  0 - 5 %   Eosinophils Absolute 0.1  0.0 - 0.7 K/uL   Basophils Relative 1  0 - 1 %   Basophils Absolute 0.0  0.0 - 0.1 K/uL  HEPATIC FUNCTION PANEL     Status: Abnormal   Collection Time    11/16/12  8:45 AM      Result Value Range   Total Protein 7.6  6.0 - 8.3 g/dL   Albumin 4.3  3.5 - 5.2 g/dL   AST 16  0 - 37 U/L   ALT 7  0 - 35 U/L   Alkaline Phosphatase 57  39 - 117 U/L   Total Bilirubin 0.2 (*) 0.3 - 1.2 mg/dL   Bilirubin, Direct <4.4  0.0 - 0.3 mg/dL   Indirect Bilirubin NOT CALCULATED  0.3 - 0.9 mg/dL  ACETAMINOPHEN LEVEL     Status: None   Collection  Time    11/16/12  8:45 AM      Result Value Range   Acetaminophen (Tylenol), Serum <15.0  10 - 30 ug/mL   Comment:            THERAPEUTIC CONCENTRATIONS VARY     SIGNIFICANTLY. A RANGE OF 10-30     ug/mL MAY BE AN EFFECTIVE     CONCENTRATION FOR MANY PATIENTS.     HOWEVER, SOME ARE BEST TREATED     AT CONCENTRATIONS OUTSIDE THIS     RANGE.     ACETAMINOPHEN CONCENTRATIONS     >150 ug/mL AT 4 HOURS AFTER     INGESTION AND >50 ug/mL AT 12     HOURS AFTER INGESTION ARE     OFTEN ASSOCIATED WITH TOXIC     REACTIONS.  SALICYLATE LEVEL     Status: Abnormal   Collection Time    11/16/12  8:45 AM      Result Value Range   Salicylate Lvl <2.0 (*) 2.8 - 20.0 mg/dL  ETHANOL     Status: Abnormal   Collection Time    11/16/12  8:45 AM      Result Value Range   Alcohol, Ethyl (B) 105 (*) 0 - 11 mg/dL   Comment:            LOWEST DETECTABLE LIMIT FOR     SERUM ALCOHOL IS 11 mg/dL     FOR MEDICAL PURPOSES ONLY  VALPROIC ACID LEVEL     Status: Abnormal   Collection Time    11/16/12  8:45 AM      Result Value Range   Valproic Acid Lvl <10.0 (*) 50.0 - 100.0 ug/mL  URINALYSIS, ROUTINE W REFLEX MICROSCOPIC     Status: Abnormal   Collection Time    11/16/12  3:36 PM      Result Value Range   Color, Urine YELLOW  YELLOW   APPearance CLOUDY (*) CLEAR   Specific Gravity, Urine 1.022  1.005 - 1.030   pH 7.5  5.0 -  8.0   Glucose, UA NEGATIVE  NEGATIVE mg/dL   Hgb urine dipstick NEGATIVE  NEGATIVE   Bilirubin Urine NEGATIVE  NEGATIVE   Ketones, ur NEGATIVE  NEGATIVE mg/dL   Protein, ur NEGATIVE  NEGATIVE mg/dL   Urobilinogen, UA 0.2  0.0 - 1.0 mg/dL   Nitrite NEGATIVE  NEGATIVE   Leukocytes, UA NEGATIVE  NEGATIVE   Comment: MICROSCOPIC NOT DONE ON URINES WITH NEGATIVE PROTEIN, BLOOD, LEUKOCYTES, NITRITE, OR GLUCOSE <1000 mg/dL.  URINE RAPID DRUG SCREEN (HOSP PERFORMED)     Status: Abnormal   Collection Time    11/16/12  3:36 PM      Result Value Range   Opiates NONE DETECTED  NONE  DETECTED   Cocaine NONE DETECTED  NONE DETECTED   Benzodiazepines NONE DETECTED  NONE DETECTED   Amphetamines NONE DETECTED  NONE DETECTED   Tetrahydrocannabinol POSITIVE (*) NONE DETECTED   Barbiturates NONE DETECTED  NONE DETECTED   Comment:            DRUG SCREEN FOR MEDICAL PURPOSES     ONLY.  IF CONFIRMATION IS NEEDED     FOR ANY PURPOSE, NOTIFY LAB     WITHIN 5 DAYS.                LOWEST DETECTABLE LIMITS     FOR URINE DRUG SCREEN     Drug Class       Cutoff (ng/mL)     Amphetamine      1000     Barbiturate      200     Benzodiazepine   200     Tricyclics       300     Opiates          300     Cocaine          300     THC              50  PREGNANCY, URINE     Status: None   Collection Time    11/16/12  3:36 PM      Result Value Range   Preg Test, Ur NEGATIVE  NEGATIVE   Comment:            THE SENSITIVITY OF THIS     METHODOLOGY IS >20 mIU/mL.    Dg Forearm Right  11/16/2012  *RADIOLOGY REPORT*  Clinical Data: Laceration  RIGHT FOREARM - 2 VIEW  Comparison: 11/02/2007  Findings: No radiopaque foreign body.  Right radius and ulna appear intact.  Mild soft tissue swelling diffusely.  IMPRESSION: No acute osseous finding or radiopaque foreign body   Original Report Authenticated By: Judie Petit. Miles Costain, M.D.     Positive for bad mood, behavior problems, bipolar, depression, mood swings and Relationship problems Blood pressure 99/59, pulse 72, temperature 98.5 F (36.9 C), temperature source Oral, resp. rate 18, last menstrual period 11/14/2012, SpO2 95.00%.   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 Patient will be referred to the [facility] for outpatient psychiatric services. Patient will  be receiving a prescription at the time of discharge to home.    Lydia Cochran,Lydia R. 11/17/2012, 12:16 PM

## 2012-11-17 NOTE — ED Notes (Signed)
telepsych report left for Dr Rulon Abide, talked to PA Ivonne Andrew about the father stating he'd take out IVC papers on the pt but didn't want the pt to know he'd talked to our staff about it. Report also given to ACTT team.

## 2012-11-17 NOTE — ED Provider Notes (Signed)
Filed Vitals:   11/17/12 0638  BP: 99/59  Pulse: 72  Temp: 98.5 F (36.9 C)  Resp: 18    Pt seen by Dr. Shela Commons yesterday, deems pt unsafe and recommends inpt admission and further treatment.  Pt is here voluntary, medically cleared.    Gavin Pound. Oletta Lamas, MD 11/17/12 [phone]

## 2012-11-17 NOTE — ED Notes (Signed)
Telepsych info given to [facility] again as it couldn't be found in the system.

## 2012-11-17 NOTE — ED Notes (Signed)
Pt said she didn't mean to stab herself it was due to her intermittent explosive d/o and she was trying to make a point to her BF that was going to leave because they argued. They live together in her dad's house and her dad pays the bills. When asked what point she was trying to make she wouldn't give an answer. She just said I'm impulsive at times and stuff happens but I wasn't trying to kill myself. Pt denies ever trying to kill herself and says when she was hospitalized before it was due to anxiety and depression and she checked herself in. She doesn't have a psychiatrist at this time but she used to see one in [place] and believes the regimen that doctor had her own worked well for her. She wouldn't say why she was in jail in April. She said her dad will pay to get her medications if she's given prescriptions since she has no insurance.

## 2012-11-17 NOTE — [facility] Suicide Risk Assessment (Signed)
Suicide Risk Assessment  Discharge Assessment     Demographic Factors:  Adolescent or young adult, Caucasian, Low socioeconomic status and Unemployed  Mental Status Per Nursing Assessment::   On Admission:     Current Mental Status by Physician: Self-harm behaviors and NA  Loss Factors: Loss of significant relationship and Financial problems/change in socioeconomic status  Historical Factors: Impulsivity and NA  Risk Reduction Factors:   Sense of responsibility to family, Religious beliefs about death, Living with another person, especially a relative, Positive social support and Positive therapeutic relationship  Continued Clinical Symptoms:  Bipolar Disorder:   Mixed State Depression:   Aggression Hopelessness Impulsivity Recent sense of peace/wellbeing Previous Psychiatric Diagnoses and Treatments Medical Diagnoses and Treatments/Surgeries  Cognitive Features That Contribute To Risk:  Closed-mindedness Polarized thinking    Suicide Risk:  Minimal: No identifiable suicidal ideation.  Patients presenting with no risk factors but with morbid ruminations; may be classified as minimal risk based on the severity of the depressive symptoms  Discharge Diagnoses:   AXIS I:  Bipolar, mixed AXIS II:  Deferred AXIS III:   Past Medical History  Diagnosis Date  . Intermittent explosive disorder   . Anxiety   . Panic attack    AXIS IV:  economic problems, educational problems, occupational problems, other psychosocial or environmental problems, problems related to social environment and problems with access to health care services AXIS V:  41-50 serious symptoms  Plan Of Care/Follow-up recommendations:  Activity:  as tolerated Diet:  regular  Is patient on multiple antipsychotic therapies at discharge:  No   Has Patient had three or more failed trials of antipsychotic monotherapy by history:  No  Recommended Plan for Multiple Antipsychotic Therapies: Not  applicable   Eriberto Felch,JANARDHAHA R. 11/17/2012, 12:10 PM

## 2012-11-17 NOTE — ED Notes (Signed)
Dressing to right thigh changed per pt request. No drainage noted to old dressing. Sutures intact. Recovered with dry gauze and paper tape.

## 2012-11-17 NOTE — BH Assessment (Signed)
[facility] Assessment Progress Note  Per notes in Assessment Office at change of shift, pt accepted to [facility] by Verne Spurr to her service of Patrick North, MD, Rm [id number].  At 10:08 I called Charyl Bigger, [facility], Assessment Counselor to notify her.  At this time, no support documents are available.  Doylene Canning, MA Assessment Counselor 11/17/2012 @ 10:10

## 2012-12-26 ENCOUNTER — Emergency Department (HOSPITAL_COMMUNITY)
Admission: EM | Admit: 2012-12-26 | Discharge: 2012-12-26 | Disposition: A | Discharge status: Home / Self Care | Payer: Self-pay | Attending: Emergency Medicine | Admitting: Emergency Medicine

## 2012-12-26 ENCOUNTER — Encounter (HOSPITAL_COMMUNITY): Payer: Self-pay | Admitting: Emergency Medicine

## 2012-12-26 DIAGNOSIS — Z8659 Personal history of other mental and behavioral disorders: Secondary | ICD-10-CM | POA: Insufficient documentation

## 2012-12-26 DIAGNOSIS — F17200 Nicotine dependence, unspecified, uncomplicated: Secondary | ICD-10-CM | POA: Insufficient documentation

## 2012-12-26 DIAGNOSIS — F319 Bipolar disorder, unspecified: Secondary | ICD-10-CM | POA: Insufficient documentation

## 2012-12-26 DIAGNOSIS — F4310 Post-traumatic stress disorder, unspecified: Secondary | ICD-10-CM | POA: Insufficient documentation

## 2012-12-26 DIAGNOSIS — Z79899 Other long term (current) drug therapy: Secondary | ICD-10-CM | POA: Insufficient documentation

## 2012-12-26 DIAGNOSIS — F419 Anxiety disorder, unspecified: Secondary | ICD-10-CM

## 2012-12-26 DIAGNOSIS — Z760 Encounter for issue of repeat prescription: Secondary | ICD-10-CM | POA: Insufficient documentation

## 2012-12-26 DIAGNOSIS — G479 Sleep disorder, unspecified: Secondary | ICD-10-CM | POA: Insufficient documentation

## 2012-12-26 DIAGNOSIS — F411 Generalized anxiety disorder: Secondary | ICD-10-CM | POA: Insufficient documentation

## 2012-12-26 HISTORY — DX: Post-traumatic stress disorder, unspecified: F43.10

## 2012-12-26 HISTORY — DX: Bipolar disorder, unspecified: F31.9

## 2012-12-26 MED ORDER — ALPRAZOLAM 1 MG PO TABS: 1.0000 mg | ORAL_TABLET | Freq: Every evening | ORAL | Status: DC | PRN

## 2012-12-26 MED ORDER — DIVALPROEX SODIUM 500 MG PO DR TAB: 500.0000 mg | DELAYED_RELEASE_TABLET | Freq: Two times a day (BID) | ORAL | Status: DC

## 2012-12-26 MED ORDER — CHLORPROMAZINE HCL 25 MG PO TABS: 25.0000 mg | ORAL_TABLET | Freq: Two times a day (BID) | ORAL | Status: DC

## 2012-12-26 NOTE — ED Provider Notes (Signed)
History     CSN: [id number]  Arrival date & time 12/26/12  4098   First MD Initiated Contact with Patient 12/26/12 [phone]      Chief Complaint  Patient presents with  . Anxiety  . Medication Refill    (Consider location/radiation/quality/duration/timing/severity/associated sxs/prior treatment) HPI Comments: Patient presents today with her father requesting refill of her psychiatric medications.  She reports that she has been seen previously by [facility].  She is no longer in [facility] and now has a Therapist, sports in [place].  However, she is unable to see her Psychiatrist until 01/08/13.  She reports that three days ago she ran out of her Xanax, Thorazine, and Depakote.  She has been on all of these medications for the past 2-3 years.  Both patient and her father report that the medications did work well to stabilize her mood.  She reports that she tried calling her new Psychiatrist and was told that she could not be seen until 01/08/13 and would need to come to the ED to get refills of the medications.  She reports that she has been increasingly irritable and anxious since being off of the medication.  She denies SI or HI.  She currently sees a therapist and also attend mental health court.  The history is provided by the patient.    Past Medical History  Diagnosis Date  . Intermittent explosive disorder   . Anxiety   . Panic attack   . Bipolar affective   . PTSD (post-traumatic stress disorder)     History reviewed. No pertinent past surgical history.  History reviewed. No pertinent family history.  History  Substance Use Topics  . Smoking status: Current Every Day Smoker  . Smokeless tobacco: Not on file  . Alcohol Use: No    OB History   Grav Para Term Preterm Abortions TAB SAB Ect Mult Living                  Review of Systems  Psychiatric/Behavioral: Positive for sleep disturbance. Negative for suicidal ideas, hallucinations, confusion and  dysphoric mood.  All other systems reviewed and are negative.    Allergies  Morphine and related; Darvocet; Tape; and Tramadol  Home Medications   Current Outpatient Rx  Name  Route  Sig  Dispense  Refill  . ALPRAZolam (XANAX) 1 MG tablet   Oral   Take 1 mg by mouth 2 (two) times daily.         . chlorproMAZINE (THORAZINE) 25 MG tablet   Oral   Take 25 mg by mouth 2 (two) times daily.         . divalproex (DEPAKOTE) 500 MG DR tablet   Oral   Take 1 tablet (500 mg total) by mouth 2 (two) times daily.   20 tablet   0   . ibuprofen (ADVIL,MOTRIN) 600 MG tablet   Oral   Take 1 tablet (600 mg total) by mouth every 8 (eight) hours as needed for pain (Temp > 38.3Celsius).   30 tablet   0     BP 106/74  Pulse 89  Temp(Src) 98.2 F (36.8 C) (Oral)  Resp 18  SpO2 100%  Physical Exam  Nursing note and vitals reviewed. Constitutional: She appears well-developed and well-nourished. No distress.  HENT:  Head: Normocephalic and atraumatic.  Eyes: EOM are normal. Pupils are equal, round, and reactive to light.  Neck: Normal range of motion. Neck supple.  Cardiovascular: Normal rate, regular rhythm  and normal heart sounds.   Pulmonary/Chest: Effort normal and breath sounds normal.  Neurological: She is alert.  Skin: Skin is warm and dry. She is not diaphoretic.  Psychiatric: Her mood appears anxious. She is not agitated, not aggressive, not withdrawn, not actively hallucinating and not combative. She expresses no homicidal and no suicidal ideation. She expresses no suicidal plans and no homicidal plans.    ED Course  Procedures (including critical care time)  Labs Reviewed - No data to display No results found.   No diagnosis found.    MDM  Patient presents today with her father requesting refills of her psychiatric medications.  She had been on the medications for years and ran out three days ago.  She denies SI or HI.  She currently sees a therapist.  Has an  appointment scheduled with Psychiatrist on 01/08/13.  Patient given prescription for enough medication to last until 01/08/13.        Pascal Lux Tucker, PA-C 12/28/12 [phone]

## 2012-12-26 NOTE — ED Notes (Signed)
Per EMs.  Pt from home.  Pt out of thorazine, depakote, xanax and phenergan for past 3 days.  Got into argument with father this am, and became anxious.  Called mental health Rn and was told to come here for eval. Pt anxious upon arrival. Denies SI/HI.

## 2012-12-28 NOTE — ED Provider Notes (Signed)
Medical screening examination/treatment/procedure(s) were performed by non-physician practitioner and as supervising physician I was immediately available for consultation/collaboration.  Seger Jani, MD 12/28/12 1652 

## 2018-10-06 ENCOUNTER — Other Ambulatory Visit (INDEPENDENT_AMBULATORY_CARE_PROVIDER_SITE_OTHER): Payer: Medicare Other

## 2018-10-06 ENCOUNTER — Other Ambulatory Visit: Payer: Self-pay

## 2018-10-06 DIAGNOSIS — Z3200 Encounter for pregnancy test, result unknown: Secondary | ICD-10-CM

## 2018-10-06 NOTE — Progress Notes (Signed)
Pt called office wanting to make NOB appt.  She had a SAB in Jan and was f/u with [facility] with a HCG of 708.  She is here today to recheck her HCG levels to make sure is this a new pregnancy or from her SAB.  Pt's details of her experience and pregnancies are sketchy.  Have reviewed her PN records from [facility]

## 2018-10-07 LAB — HCG, QUANTITATIVE, PREGNANCY: HCG, Total, QN: 65303 m[IU]/mL

## 2018-10-31 ENCOUNTER — Inpatient Hospital Stay (HOSPITAL_COMMUNITY)
Admission: AD | Admit: 2018-10-31 | Discharge: 2018-10-31 | Disposition: A | Discharge status: Home / Self Care | Payer: Medicare Other | Attending: Family Medicine | Admitting: Family Medicine

## 2018-10-31 ENCOUNTER — Encounter (HOSPITAL_COMMUNITY): Payer: Self-pay

## 2018-10-31 ENCOUNTER — Other Ambulatory Visit: Payer: Self-pay

## 2018-10-31 ENCOUNTER — Inpatient Hospital Stay (HOSPITAL_COMMUNITY): Payer: Medicare Other

## 2018-10-31 DIAGNOSIS — F17200 Nicotine dependence, unspecified, uncomplicated: Secondary | ICD-10-CM | POA: Diagnosis not present

## 2018-10-31 DIAGNOSIS — F319 Bipolar disorder, unspecified: Secondary | ICD-10-CM | POA: Insufficient documentation

## 2018-10-31 DIAGNOSIS — N760 Acute vaginitis: Secondary | ICD-10-CM

## 2018-10-31 DIAGNOSIS — B9689 Other specified bacterial agents as the cause of diseases classified elsewhere: Secondary | ICD-10-CM | POA: Diagnosis not present

## 2018-10-31 DIAGNOSIS — F4310 Post-traumatic stress disorder, unspecified: Secondary | ICD-10-CM | POA: Insufficient documentation

## 2018-10-31 DIAGNOSIS — R109 Unspecified abdominal pain: Secondary | ICD-10-CM | POA: Diagnosis not present

## 2018-10-31 DIAGNOSIS — Z79899 Other long term (current) drug therapy: Secondary | ICD-10-CM | POA: Insufficient documentation

## 2018-10-31 DIAGNOSIS — O4690 Antepartum hemorrhage, unspecified, unspecified trimester: Secondary | ICD-10-CM

## 2018-10-31 DIAGNOSIS — O034 Incomplete spontaneous abortion without complication: Secondary | ICD-10-CM | POA: Diagnosis present

## 2018-10-31 DIAGNOSIS — N939 Abnormal uterine and vaginal bleeding, unspecified: Secondary | ICD-10-CM | POA: Insufficient documentation

## 2018-10-31 LAB — CBC
HCT: 34.7 % — ABNORMAL LOW (ref 36.0–46.0)
Hemoglobin: 11.8 g/dL — ABNORMAL LOW (ref 12.0–15.0)
MCH: 29.5 pg (ref 26.0–34.0)
MCHC: 34 g/dL (ref 30.0–36.0)
MCV: 86.8 fL (ref 80.0–100.0)
Platelets: 242 10*3/uL (ref 150–400)
RBC: 4 MIL/uL (ref 3.87–5.11)
RDW: 12.2 % (ref 11.5–15.5)
WBC: 13.7 10*3/uL — ABNORMAL HIGH (ref 4.0–10.5)
nRBC: 0 % (ref 0.0–0.2)

## 2018-10-31 LAB — RAPID URINE DRUG SCREEN, HOSP PERFORMED
Amphetamines: NOT DETECTED
Barbiturates: NOT DETECTED
Benzodiazepines: POSITIVE — AB
Cocaine: NOT DETECTED
Opiates: POSITIVE — AB
Tetrahydrocannabinol: POSITIVE — AB

## 2018-10-31 LAB — TYPE AND SCREEN
ABO/RH(D): O POS
Antibody Screen: NEGATIVE

## 2018-10-31 LAB — WET PREP, GENITAL
Sperm: NONE SEEN
Trich, Wet Prep: NONE SEEN
WBC, Wet Prep HPF POC: NONE SEEN
Yeast Wet Prep HPF POC: NONE SEEN

## 2018-10-31 LAB — HCG, QUANTITATIVE, PREGNANCY: hCG, Beta Chain, Quant, S: 1018 m[IU]/mL — ABNORMAL HIGH (ref <5)

## 2018-10-31 MED ORDER — METRONIDAZOLE 500 MG PO TABS: 500.0000 mg | ORAL_TABLET | Freq: Two times a day (BID) | ORAL | 0 refills | Status: DC

## 2018-10-31 MED ORDER — HYDROMORPHONE HCL 1 MG/ML IJ SOLN
INTRAMUSCULAR | Status: AC
  Administered 2018-10-31: 1 mg via INTRAVENOUS
  Filled 2018-10-31: qty 1

## 2018-10-31 MED ORDER — ONDANSETRON HCL 4 MG/2ML IJ SOLN
4.0000 mg | Freq: Once | INTRAMUSCULAR | Status: AC
  Administered 2018-10-31: 08:00:00 4 mg via INTRAVENOUS

## 2018-10-31 MED ORDER — LACTATED RINGERS IV BOLUS
1000.0000 mL | Freq: Once | INTRAVENOUS | Status: AC
  Administered 2018-10-31: 08:00:00 1000 mL via INTRAVENOUS

## 2018-10-31 MED ORDER — HYDROMORPHONE HCL 1 MG/ML IJ SOLN: 1.0000 mg | Freq: Once | INTRAMUSCULAR | Status: AC

## 2018-10-31 MED ORDER — ONDANSETRON HCL 4 MG/2ML IJ SOLN
INTRAMUSCULAR | Status: AC
  Filled 2018-10-31: qty 2

## 2018-10-31 NOTE — MAU Note (Signed)
Pt arrived EMS, having vaginal bleeding since yesterday, pain is 10/10.

## 2018-10-31 NOTE — Discharge Instructions (Signed)
Bacterial Vaginosis    Bacterial vaginosis is a vaginal infection that occurs when the normal balance of bacteria in the vagina is disrupted. It results from an overgrowth of certain bacteria. This is the most common vaginal infection among women ages 15-44.  Because bacterial vaginosis increases your risk for STIs (sexually transmitted infections), getting treated can help reduce your risk for chlamydia, gonorrhea, herpes, and HIV (human immunodeficiency virus). Treatment is also important for preventing complications in pregnant women, because this condition can cause an early (premature) delivery.  What are the causes?  This condition is caused by an increase in harmful bacteria that are normally present in small amounts in the vagina. However, the reason that the condition develops is not fully understood.  What increases the risk?  The following factors may make you more likely to develop this condition:  · Having a new sexual partner or multiple sexual partners.  · Having unprotected sex.  · Douching.  · Having an intrauterine device (IUD).  · Smoking.  · Drug and alcohol abuse.  · Taking certain antibiotic medicines.  · Being pregnant.  You cannot get bacterial vaginosis from toilet seats, bedding, swimming pools, or contact with objects around you.  What are the signs or symptoms?  Symptoms of this condition include:  · Grey or white vaginal discharge. The discharge can also be watery or foamy.  · A fish-like odor with discharge, especially after sexual intercourse or during menstruation.  · Itching in and around the vagina.  · Burning or pain with urination.  Some women with bacterial vaginosis have no signs or symptoms.  How is this diagnosed?  This condition is diagnosed based on:  · Your medical history.  · A physical exam of the vagina.  · Testing a sample of vaginal fluid under a microscope to look for a large amount of bad bacteria or abnormal cells. Your health care provider may use a cotton swab or  a small wooden spatula to collect the sample.  How is this treated?  This condition is treated with antibiotics. These may be given as a pill, a vaginal cream, or a medicine that is put into the vagina (suppository). If the condition comes back after treatment, a second round of antibiotics may be needed.  Follow these instructions at home:  Medicines  · Take over-the-counter and prescription medicines only as told by your health care provider.  · Take or use your antibiotic as told by your health care provider. Do not stop taking or using the antibiotic even if you start to feel better.  General instructions  · If you have a female sexual partner, tell her that you have a vaginal infection. She should see her health care provider and be treated if she has symptoms. If you have a female sexual partner, he does not need treatment.  · During treatment:  ? Avoid sexual activity until you finish treatment.  ? Do not douche.  ? Avoid alcohol as directed by your health care provider.  ? Avoid breastfeeding as directed by your health care provider.  · Drink enough water and fluids to keep your urine clear or pale yellow.  · Keep the area around your vagina and rectum clean.  ? Wash the area daily with warm water.  ? Wipe yourself from front to back after using the toilet.  · Keep all follow-up visits as told by your health care provider. This is important.  How is this prevented?  · Do not   douche.  · Wash the outside of your vagina with warm water only.  · Use protection when having sex. This includes latex condoms and dental dams.  · Limit how many sexual partners you have. To help prevent bacterial vaginosis, it is best to have sex with just one partner (monogamous).  · Make sure you and your sexual partner are tested for STIs.  · Wear cotton or cotton-lined underwear.  · Avoid wearing tight pants and pantyhose, especially during summer.  · Limit the amount of alcohol that you drink.  · Do not use any products that contain  nicotine or tobacco, such as cigarettes and e-cigarettes. If you need help quitting, ask your health care provider.  · Do not use illegal drugs.  Where to find more information  · Centers for Disease Control and Prevention: www.cdc.gov/std  · American Sexual Health Association (ASHA): www.ashastd.org  · U.S. Department of Health and Human Services, Office on Women's Health: www.womenshealth.gov/ or https://www.womenshealth.gov/a-z-topics/bacterial-vaginosis  Contact a health care provider if:  · Your symptoms do not improve, even after treatment.  · You have more discharge or pain when urinating.  · You have a fever.  · You have pain in your abdomen.  · You have pain during sex.  · You have vaginal bleeding between periods.  Summary  · Bacterial vaginosis is a vaginal infection that occurs when the normal balance of bacteria in the vagina is disrupted.  · Because bacterial vaginosis increases your risk for STIs (sexually transmitted infections), getting treated can help reduce your risk for chlamydia, gonorrhea, herpes, and HIV (human immunodeficiency virus). Treatment is also important for preventing complications in pregnant women, because the condition can cause an early (premature) delivery.  · This condition is treated with antibiotic medicines. These may be given as a pill, a vaginal cream, or a medicine that is put into the vagina (suppository).  This information is not intended to replace advice given to you by your health care provider. Make sure you discuss any questions you have with your health care provider.  Document Released: 06/28/2005 Document Revised: 11/01/2016 Document Reviewed: 03/13/2016  Elsevier Interactive Patient Education © 2019 [employer]

## 2018-10-31 NOTE — MAU Note (Signed)
Pt left ultrasound while tech was scanning report and walked back to the unit. Requested urine sample again and pt said she just used the bathroom in ultrasound.

## 2018-10-31 NOTE — MAU Provider Note (Addendum)
History     CSN: [id number]  Arrival date and time: 10/31/18 0700   First Provider Initiated Contact with Patient 10/31/18 [phone]      Chief Complaint  Patient presents with  . Vaginal Bleeding  . Abdominal Pain   [id number]  gestation presenting with VB. Bleeding started yesterday but became heavier this am. Soaked 3 pads in 2 hrs. Having LAP. Rates 10/10. Took Ibuprofen but didn't help. Unsure of gestational age, has not had menses since SAB in January.    OB History    Gravida  7   Para  1   Term  1   Preterm      AB  5   Living  1     SAB  4   TAB  1   Ectopic      Multiple      Live Births  1           Past Medical History:  Diagnosis Date  . Anxiety   . Bipolar affective (HCC)   . Intermittent explosive disorder   . Panic attack   . PTSD (post-traumatic stress disorder)     No past surgical history on file.  No family history on file.  Social History   Tobacco Use  . Smoking status: Current Every Day Smoker  Substance Use Topics  . Alcohol use: No  . Drug use: No    Allergies:  Allergies  Allergen Reactions  . Morphine And Related Hives  . Darvocet [Propoxyphene N-Acetaminophen] Itching and Nausea And Vomiting  . Tape     Plastic tape gives her a reaction. Paper tape must be used.   . Tramadol Nausea And Vomiting    Medications Prior to Admission  Medication Sig Dispense Refill Last Dose  . ALPRAZolam (XANAX) 1 MG tablet Take 1 mg by mouth 2 (two) times daily.   12/23/2012 at Unknown  . ALPRAZolam (XANAX) 1 MG tablet Take 1 tablet (1 mg total) by mouth at bedtime as needed for sleep. 14 tablet 0   . chlorproMAZINE (THORAZINE) 25 MG tablet Take 25 mg by mouth 2 (two) times daily.   12/23/2012 at Unknown  . chlorproMAZINE (THORAZINE) 25 MG tablet Take 1 tablet (25 mg total) by mouth 2 (two) times daily. 28 tablet 0   . divalproex (DEPAKOTE) 500 MG DR tablet Take 1 tablet (500 mg total) by mouth 2 (two) times daily. 20 tablet 0  Past Week at Unknown  . divalproex (DEPAKOTE) 500 MG DR tablet Take 1 tablet (500 mg total) by mouth 2 (two) times daily. 28 tablet 0   . ibuprofen (ADVIL,MOTRIN) 600 MG tablet Take 1 tablet (600 mg total) by mouth every 8 (eight) hours as needed for pain (Temp > 38.3Celsius). 30 tablet 0 12/25/2012 at Unknown    Review of Systems  Gastrointestinal: Positive for abdominal pain.  Genitourinary: Positive for vaginal bleeding.   Physical Exam   Blood pressure 111/66, pulse 68, temperature 97.6 F (36.4 C), temperature source Oral, resp. rate 20, SpO2 100 %.  Physical Exam  Constitutional: She is oriented to person, place, and time. She appears well-developed and well-nourished. No distress.  HENT:  Head: Normocephalic and atraumatic.  Neck: Normal range of motion.  Cardiovascular: Normal rate.  Respiratory: Effort normal. No respiratory distress.  Genitourinary:    Genitourinary Comments: Small fetus and POCs in vault, removed Cervix 1cm, bleeding small   Musculoskeletal: Normal range of motion.  Neurological: She is alert and oriented to person,  place, and time.  Skin: Skin is warm and dry.  Psychiatric: Her mood appears anxious. She is slowed.   Results for orders placed or performed during the hospital encounter of 10/31/18 (from the past 24 hour(s))  Type and screen [facility]     Status: None   Collection Time: 10/31/18  7:45 AM  Result Value Ref Range   ABO/RH(D) O POS    Antibody Screen NEG    Sample Expiration      11/03/2018 Performed at [facility], [address], [place], [place] [address]   CBC     Status: Abnormal   Collection Time: 10/31/18  7:54 AM  Result Value Ref Range   WBC 13.7 (H) 4.0 - 10.5 K/uL   RBC 4.00 3.87 - 5.11 MIL/uL   Hemoglobin 11.8 (L) 12.0 - 15.0 g/dL   HCT 28.0 (L) 03.4 - 91.7 %   MCV 86.8 80.0 - 100.0 fL   MCH 29.5 26.0 - 34.0 pg   MCHC 34.0 30.0 - 36.0 g/dL   RDW 91.5 05.6 - 97.9 %   Platelets 242 150 - 400  K/uL   nRBC 0.0 0.0 - 0.2 %  hCG, quantitative, pregnancy     Status: Abnormal   Collection Time: 10/31/18  7:54 AM  Result Value Ref Range   hCG, Beta Chain, Quant, S 1,018 (H) <5 mIU/mL  Wet prep, genital     Status: Abnormal   Collection Time: 10/31/18  8:00 AM  Result Value Ref Range   Yeast Wet Prep HPF POC NONE SEEN NONE SEEN   Trich, Wet Prep NONE SEEN NONE SEEN   Clue Cells Wet Prep HPF POC PRESENT (A) NONE SEEN   WBC, Wet Prep HPF POC NONE SEEN NONE SEEN   Sperm NONE SEEN     MAU Course  Procedures Orders Placed This Encounter  Procedures  . Wet prep, genital    Standing Status:   Standing    Number of Occurrences:   1  . US OB LESS THAN 14 WEEKS WITH OB TRANSVAGINAL    Standing Status:   Standing    Number of Occurrences:   1    Order Specific Question:   Symptom/Reason for Exam    Answer:   Vaginal bleeding in pregnancy [[id number]]  . CBC    Standing Status:   Standing    Number of Occurrences:   1  . hCG, quantitative, pregnancy    Standing Status:   Standing    Number of Occurrences:   1  . Urine rapid drug screen (hosp performed)    Standing Status:   Standing    Number of Occurrences:   1  . [facility] screen  [facility]     Standing Status:   Standing    Number of Occurrences:   1   Meds ordered this encounter  Medications  . lactated ringers bolus 1,000 mL  . ondansetron (ZOFRAN) injection 4 mg  . HYDROmorphone (DILAUDID) injection 1 mg  . ondansetron (ZOFRAN) 4 MG/2ML injection    Spurlock-Frizzell, J: cabinet override  . HYDROmorphone (DILAUDID) 1 MG/ML injection    Spurlock-Frizzell, J: cabinet override   MDM Labs and [place] ordered. Transfer of care given to [place], [place]  10/31/2018 8:13 AM    Assessment and Plan  Bacterial Vaginosis Incomplete Spontaneous Abortion-Resolved O Positive  -In room to discuss [place] results and need for  follow up -Message [facility] for  follow up lab and televisit in 1-2 weeks as appropriate. -Patient informed of blood type for future reference. -Nurse instructed to report BV diagnosis and treatment. -Bleeding precautions discussed and patient encouraged to abstain from sex until discontinuation of bleeding. Further encouraged to attempt conception after 2 normal menses.  -No questions or concerns. -UDS pending -Rx for Flagyl 500mg  BID Disp 14, RF 0 sent to pharmacy on file.  -Encouraged to call or return to MAU if symptoms worsen or with the onset of new symptoms. -Discharged to home in stable condition  Cherre RobinsJessica L Hassani Sliney MSN, CNM 10/31/2018 9:48 AM

## 2018-11-01 ENCOUNTER — Encounter: Payer: Self-pay | Admitting: Obstetrics and Gynecology

## 2018-11-01 LAB — ABO/RH: ABO/RH(D): O POS

## 2018-11-01 LAB — GC/CHLAMYDIA PROBE AMP (~~LOC~~) NOT AT ARMC
Chlamydia: NEGATIVE
Neisseria Gonorrhea: NEGATIVE

## 2018-11-10 ENCOUNTER — Ambulatory Visit (INDEPENDENT_AMBULATORY_CARE_PROVIDER_SITE_OTHER): Payer: Medicare Other | Admitting: Sports Medicine

## 2018-11-10 ENCOUNTER — Ambulatory Visit (INDEPENDENT_AMBULATORY_CARE_PROVIDER_SITE_OTHER): Payer: Medicare Other

## 2018-11-10 ENCOUNTER — Other Ambulatory Visit: Payer: Self-pay

## 2018-11-10 ENCOUNTER — Encounter: Payer: Self-pay | Admitting: Sports Medicine

## 2018-11-10 DIAGNOSIS — M7551 Bursitis of right shoulder: Secondary | ICD-10-CM

## 2018-11-10 MED ORDER — MELOXICAM 15 MG PO TABS: ORAL_TABLET | ORAL | 3 refills | Status: DC

## 2018-11-10 NOTE — Progress Notes (Signed)
Subjective:    CC: Right shoulder pain  HPI:  This is a pleasant 28 year old female on disability, for the past few weeks she is had pain in her right shoulder, localized over the deltoid, worse with abduction, overhead activities, waking her from sleeping, no trauma.  She spoke some of her friends and they recommended to try a steroid injection.  Nothing radicular, no pain in the neck.  I reviewed the past medical history, family history, social history, surgical history, and allergies today and no changes were needed.  Please see the problem list section below in epic for further details.  Past Medical History: Past Medical History:  Diagnosis Date  . Anxiety   . Bipolar affective (HCC)   . Intermittent explosive disorder   . Panic attack   . PTSD (post-traumatic stress disorder)    Past Surgical History: No past surgical history on file. Social History: Social History   Socioeconomic History  . Marital status: Married    Spouse name: Not on file  . Number of children: Not on file  . Years of education: Not on file  . Highest education level: Not on file  Occupational History  . Not on file  Social Needs  . Financial resource strain: Not on file  . Food insecurity:    Worry: Not on file    Inability: Not on file  . Transportation needs:    Medical: Not on file    Non-medical: Not on file  Tobacco Use  . Smoking status: Former Games developer  . Smokeless tobacco: Never Used  Substance and Sexual Activity  . Alcohol use: No  . Drug use: No  . Sexual activity: Yes  Lifestyle  . Physical activity:    Days per week: Not on file    Minutes per session: Not on file  . Stress: Not on file  Relationships  . Social connections:    Talks on phone: Not on file    Gets together: Not on file    Attends religious service: Not on file    Active member of club or organization: Not on file    Attends meetings of clubs or organizations: Not on file    Relationship status: Not on  file  Other Topics Concern  . Not on file  Social History Narrative  . Not on file   Family History: No family history on file. Allergies: Allergies  Allergen Reactions  . Morphine And Related Hives  . Darvocet [Propoxyphene N-Acetaminophen] Itching and Nausea And Vomiting  . Tape     Plastic tape gives her a reaction. Paper tape must be used.   . Tramadol Nausea And Vomiting   Medications: See med rec.  Review of Systems: No headache, visual changes, nausea, vomiting, diarrhea, constipation, dizziness, abdominal pain, skin rash, fevers, chills, night sweats, swollen lymph nodes, weight loss, chest pain, body aches, joint swelling, muscle aches, shortness of breath, mood changes, visual or auditory hallucinations.  Objective:    General: Well Developed, well nourished, and in no acute distress.  Neuro: Alert and oriented x3, extra-ocular muscles intact, sensation grossly intact.  HEENT: Normocephalic, atraumatic, pupils equal round reactive to light, neck supple, no masses, no lymphadenopathy, thyroid nonpalpable.  Skin: Warm and dry, no rashes noted.  Cardiac: Regular rate and rhythm, no murmurs rubs or gallops.  Respiratory: Clear to auscultation bilaterally. Not using accessory muscles, speaking in full sentences.  Abdominal: Soft, nontender, nondistended, positive bowel sounds, no masses, no organomegaly.  Right shoulder: Inspection reveals  no abnormalities, atrophy or asymmetry. Palpation is normal with no tenderness over AC joint or bicipital groove. ROM is full in all planes. Rotator cuff strength normal throughout. Positive Neer and Hawkin's tests, empty can. Speeds and Yergason's tests normal. No labral pathology noted with negative Obrien's, negative crank, negative clunk, and good stability. Normal scapular function observed. No painful arc and no drop arm sign. No apprehension sign  Procedure: Real-time Ultrasound Guided injection of the right subacromial bursa  Device: GE Logiq E  Verbal informed consent obtained.  Time-out conducted.  Noted no overlying erythema, induration, or other signs of local infection.  Skin prepped in a sterile fashion.  Local anesthesia: Topical Ethyl chloride.  With sterile technique and under real time ultrasound guidance:  1 cc Kenalog 40, 1 cc lidocaine, 1 cc bupivacaine injected easily Completed without difficulty  Pain immediately resolved suggesting accurate placement of the medication.  Advised to call if fevers/chills, erythema, induration, drainage, or persistent bleeding.  Images permanently stored and available for review in the ultrasound unit.  Impression: Technically successful ultrasound guided injection.   Impression and Recommendations:    The patient was counselled, risk factors were discussed, anticipatory guidance given.  Acute shoulder bursitis, right Per patient request aggressive treatment including oral NSAIDs, subacromial injection, x-rays. Rehab exercises. Return to see me in 4 to 6 weeks.   ___________________________________________ Ihor Austinhomas J. Benjamin Stainhekkekandam, M.D., ABFM., CAQSM. Primary Care and Sports Medicine [facility]  Adjunct Professor of Family Medicine  [employer]

## 2018-11-10 NOTE — Assessment & Plan Note (Signed)
Per patient request aggressive treatment including oral NSAIDs, subacromial injection, x-rays. Rehab exercises. Return to see me in 4 to 6 weeks.

## 2018-11-17 ENCOUNTER — Other Ambulatory Visit: Payer: Self-pay

## 2018-11-17 ENCOUNTER — Encounter: Payer: Self-pay | Admitting: Advanced Practice Midwife

## 2018-11-17 ENCOUNTER — Ambulatory Visit (INDEPENDENT_AMBULATORY_CARE_PROVIDER_SITE_OTHER): Payer: Medicare Other | Admitting: Advanced Practice Midwife

## 2018-11-17 DIAGNOSIS — F329 Major depressive disorder, single episode, unspecified: Secondary | ICD-10-CM

## 2018-11-17 DIAGNOSIS — Z3009 Encounter for other general counseling and advice on contraception: Secondary | ICD-10-CM | POA: Diagnosis not present

## 2018-11-17 DIAGNOSIS — O039 Complete or unspecified spontaneous abortion without complication: Secondary | ICD-10-CM | POA: Diagnosis not present

## 2018-11-17 DIAGNOSIS — Z30011 Encounter for initial prescription of contraceptive pills: Secondary | ICD-10-CM | POA: Diagnosis not present

## 2018-11-17 MED ORDER — NORGESTIMATE-ETH ESTRADIOL 0.25-35 MG-MCG PO TABS: 1.0000 | ORAL_TABLET | Freq: Every day | ORAL | 11 refills | Status: DC

## 2018-11-17 NOTE — Progress Notes (Signed)
[facility] GYNECOLOGY VISIT ENCOUNTER NOTE  I connected with Lydia Cochran on 11/17/18 at  9:00 AM EDT by WebEx at home and verified that I am speaking with the correct person using two identifiers.   I discussed the limitations, risks, security and privacy concerns of performing an evaluation and management service by telephone and the availability of in person appointments. I also discussed with the patient that there may be a patient responsible charge related to this service. The patient expressed understanding and agreed to proceed.   History:  Lydia Cochran is a 28 y.o. [phone] female being evaluated today for miscarriage follow up. She presented 10/06/18 to her PCP and had hcg of 65, 303.  On 10/31/18, she presented to MAU with heavy bleeding and passed POCs and had hcg of 1, 018.  Since 10/31/18 she reports no bleeding and no pain. She is depressed with the miscarriage and reports this is her third in the last year.  She has a 29 year old and states that her pregnancy was uncomplicated.  She has 2 different counselors and is talking with them about her feelings. She denies any feelings of harming herself or others.  She denies any abnormal vaginal discharge, bleeding, pelvic pain or other concerns.       Past Medical History:  Diagnosis Date  . Anxiety   . Bipolar affective (HCC)   . Intermittent explosive disorder   . Panic attack   . PTSD (post-traumatic stress disorder)    Past Surgical History:  Procedure Laterality Date  . DILATION AND CURETTAGE OF UTERUS     The following portions of the patient's history were reviewed and updated as appropriate: allergies, current medications, past family history, past medical history, past social history, past surgical history and problem list.   Health Maintenance:    Review of Systems:  Pertinent items noted in HPI and remainder of comprehensive ROS otherwise negative.  Physical Exam:   General:  Alert, oriented and  cooperative. Patient appears to be in no acute distress.  Mental Status: Normal mood and affect. Normal behavior. Normal judgment and thought content.   Respiratory: Normal respiratory effort, no problems with respiration noted  Rest of physical exam deferred due to type of encounter  Labs and Imaging No results found for this or any previous visit (from the past 336 hour(s)). Dg Shoulder Right  Result Date: 11/10/2018 CLINICAL DATA:  Right shoulder pain and tenderness for 1 week. No known injury. EXAM: RIGHT SHOULDER - 2+ VIEW COMPARISON:  None. FINDINGS: There is no evidence of fracture or dislocation. There is no evidence of arthropathy or other focal bone abnormality. Soft tissues are unremarkable. IMPRESSION: Negative. Electronically Signed   By: Myles Rosenthal M.D.   On: 11/10/2018 16:40   US Ob Less Than 14 Weeks With Ob Transvaginal  Result Date: 10/31/2018 CLINICAL DATA:  Vaginal bleeding EXAM: OBSTETRIC <14 WK [place] AND TRANSVAGINAL OB US TECHNIQUE: Both transabdominal and transvaginal ultrasound examinations were performed for complete evaluation of the gestation as well as the maternal uterus, adnexal regions, and pelvic cul-de-sac. Transvaginal technique was performed to assess early pregnancy. COMPARISON:  None. FINDINGS: Intrauterine gestational sac: Not visualized Yolk sac:  Not visualized Embryo:  Not visualized Cardiac Activity: Not visualized Subchorionic hemorrhage:  None visualized. Maternal uterus/adnexae: The endometrium appears mildly thickened and mildly inhomogeneous. There is no well-defined mass. There are no extrauterine pelvic or adnexal masses. Right ovary measures 3.1 x 2.7 x 1.9 cm. Left ovary  measures 3.0 x 2.2 x 2.4 cm. Dominant follicle/corpus luteum noted in right or free period no free pelvic fluid. IMPRESSION: No intrauterine gestation is evident. Endometrium is somewhat thickened and inhomogeneous in appearance. Assuming positive serum pregnancy test, differential  considerations must include potential spontaneous abortion, intrauterine gestation too early to be seen by either transabdominal transvaginal technique, or possible ectopic gestation. Close clinical and laboratory surveillance in this regard advised. Timing of repeat imaging in large part will depend on beta HCG values going forward. Note that there may well be a degree of hemorrhage within the endometrium currently. No extrauterine lesions evident. No free pelvic fluid. Electronically Signed   By: Bretta BangWilliam  Woodruff III M.D.   On: 10/31/2018 08:26       Assessment and Plan:     1. SAB (spontaneous abortion) --Significant drop in hcg and US on 4/21 c/w SAB.  Pt with minimal bleeding/pain so likely completed on 10/31/18 in MAU.   --Pt with multiple SABs, reports she has had full work up and nothing was found. --Pt to f/u as desired in our office for evaluation.  2. Encounter for counseling regarding contraception Discussed LARCs as most effective forms of birth control.  Discussed benefits/risks of other methods.  Pt desires OCPs.  Discussed failure rates of OCPs with regular use. Reviewed risk factors and s/sx of DVT/PE/reasons to seek medical care. Rx for Sprintec sent to pharmacy.  3. Reactive depression --Pt reports depressed symptoms and has outpatient counseling at this time.  Pt to follow up with us or behavioral health if symptoms worsen or she needs more to manage them.      I discussed the assessment and treatment plan with the patient. The patient was provided an opportunity to ask questions and all were answered. The patient agreed with the plan and demonstrated an understanding of the instructions.   The patient was advised to call back or seek an in-person evaluation/go to the ED if the symptoms worsen or if the condition fails to improve as anticipated.  I provided 12 minutes of face-to-face via WebEx time during this encounter.   Sharen CounterLisa Leftwich-Kirby, CNM [facility], [facility]

## 2018-11-17 NOTE — Progress Notes (Signed)
Wants to start OCP

## 2018-12-08 ENCOUNTER — Ambulatory Visit: Payer: Medicare Other | Admitting: Sports Medicine

## 2019-03-22 ENCOUNTER — Emergency Department (HOSPITAL_COMMUNITY): Payer: Medicare Other

## 2019-03-22 ENCOUNTER — Other Ambulatory Visit: Payer: Self-pay

## 2019-03-22 ENCOUNTER — Emergency Department (HOSPITAL_COMMUNITY)
Admission: EM | Admit: 2019-03-22 | Discharge: 2019-03-23 | Disposition: A | Discharge status: Home / Self Care | Payer: Medicare Other | Attending: Emergency Medicine | Admitting: Emergency Medicine

## 2019-03-22 ENCOUNTER — Encounter (HOSPITAL_COMMUNITY): Payer: Self-pay | Admitting: Emergency Medicine

## 2019-03-22 DIAGNOSIS — O26891 Other specified pregnancy related conditions, first trimester: Secondary | ICD-10-CM | POA: Diagnosis not present

## 2019-03-22 DIAGNOSIS — M546 Pain in thoracic spine: Secondary | ICD-10-CM | POA: Diagnosis not present

## 2019-03-22 DIAGNOSIS — Y999 Unspecified external cause status: Secondary | ICD-10-CM | POA: Insufficient documentation

## 2019-03-22 DIAGNOSIS — S80212A Abrasion, left knee, initial encounter: Secondary | ICD-10-CM | POA: Diagnosis not present

## 2019-03-22 DIAGNOSIS — S62646A Nondisplaced fracture of proximal phalanx of right little finger, initial encounter for closed fracture: Secondary | ICD-10-CM | POA: Insufficient documentation

## 2019-03-22 DIAGNOSIS — S80211A Abrasion, right knee, initial encounter: Secondary | ICD-10-CM | POA: Insufficient documentation

## 2019-03-22 DIAGNOSIS — M542 Cervicalgia: Secondary | ICD-10-CM | POA: Diagnosis not present

## 2019-03-22 DIAGNOSIS — S62316A Displaced fracture of base of fifth metacarpal bone, right hand, initial encounter for closed fracture: Secondary | ICD-10-CM | POA: Insufficient documentation

## 2019-03-22 DIAGNOSIS — R51 Headache: Secondary | ICD-10-CM | POA: Insufficient documentation

## 2019-03-22 DIAGNOSIS — Y929 Unspecified place or not applicable: Secondary | ICD-10-CM | POA: Insufficient documentation

## 2019-03-22 DIAGNOSIS — S6291XA Unspecified fracture of right wrist and hand, initial encounter for closed fracture: Secondary | ICD-10-CM

## 2019-03-22 DIAGNOSIS — Y939 Activity, unspecified: Secondary | ICD-10-CM | POA: Diagnosis not present

## 2019-03-22 DIAGNOSIS — S62620A Displaced fracture of medial phalanx of right index finger, initial encounter for closed fracture: Secondary | ICD-10-CM | POA: Diagnosis not present

## 2019-03-22 DIAGNOSIS — Z3A01 Less than 8 weeks gestation of pregnancy: Secondary | ICD-10-CM | POA: Insufficient documentation

## 2019-03-22 LAB — BASIC METABOLIC PANEL
Anion gap: 10 (ref 5–15)
BUN: 8 mg/dL (ref 6–20)
CO2: 21 mmol/L — ABNORMAL LOW (ref 22–32)
Calcium: 10.2 mg/dL (ref 8.9–10.3)
Chloride: 104 mmol/L (ref 98–111)
Creatinine, Ser: 0.64 mg/dL (ref 0.44–1.00)
GFR calc Af Amer: >60 mL/min (ref >60)
GFR calc non Af Amer: >60 mL/min (ref >60)
Glucose, Bld: 100 mg/dL — ABNORMAL HIGH (ref 70–99)
Potassium: 3.3 mmol/L — ABNORMAL LOW (ref 3.5–5.1)
Sodium: 135 mmol/L (ref 135–145)

## 2019-03-22 LAB — URINALYSIS, ROUTINE W REFLEX MICROSCOPIC
Bilirubin Urine: NEGATIVE
Glucose, UA: NEGATIVE mg/dL
Hgb urine dipstick: NEGATIVE
Ketones, ur: NEGATIVE mg/dL
Leukocytes,Ua: NEGATIVE
Nitrite: NEGATIVE
Protein, ur: 30 mg/dL — AB
Specific Gravity, Urine: 1.018 (ref 1.005–1.030)
pH: 7 (ref 5.0–8.0)

## 2019-03-22 LAB — CBC
HCT: 39.9 % (ref 36.0–46.0)
Hemoglobin: 13.5 g/dL (ref 12.0–15.0)
MCH: 30.3 pg (ref 26.0–34.0)
MCHC: 33.8 g/dL (ref 30.0–36.0)
MCV: 89.5 fL (ref 80.0–100.0)
Platelets: 307 10*3/uL (ref 150–400)
RBC: 4.46 MIL/uL (ref 3.87–5.11)
RDW: 12.6 % (ref 11.5–15.5)
WBC: 7.4 10*3/uL (ref 4.0–10.5)
nRBC: 0 % (ref 0.0–0.2)

## 2019-03-22 LAB — HCG, QUANTITATIVE, PREGNANCY: hCG, Beta Chain, Quant, S: 64 m[IU]/mL — ABNORMAL HIGH (ref <5)

## 2019-03-22 MED ORDER — SODIUM CHLORIDE 0.9 % IV SOLN
INTRAVENOUS | Status: DC
  Administered 2019-03-22: 22:00:00 via INTRAVENOUS

## 2019-03-22 MED ORDER — HYDROCODONE-ACETAMINOPHEN 5-325 MG PO TABS
1.0000 | ORAL_TABLET | Freq: Once | ORAL | Status: DC
  Filled 2019-03-22: qty 1

## 2019-03-22 MED ORDER — HYDROCODONE-ACETAMINOPHEN 5-325 MG PO TABS: 1.0000 | ORAL_TABLET | Freq: Four times a day (QID) | ORAL | 0 refills | Status: DC | PRN

## 2019-03-22 MED ORDER — HYDROMORPHONE HCL 1 MG/ML IJ SOLN
1.0000 mg | Freq: Once | INTRAMUSCULAR | Status: AC
  Administered 2019-03-22: 1 mg via INTRAVENOUS
  Filled 2019-03-22: qty 1

## 2019-03-22 MED ORDER — HYDROCODONE-ACETAMINOPHEN 5-325 MG PO TABS
1.0000 | ORAL_TABLET | Freq: Once | ORAL | Status: AC
  Administered 2019-03-22: 1 via ORAL
  Filled 2019-03-22: qty 1

## 2019-03-22 MED ORDER — DIPHENHYDRAMINE HCL 50 MG/ML IJ SOLN
25.0000 mg | Freq: Once | INTRAMUSCULAR | Status: AC
  Administered 2019-03-22: 22:00:00 25 mg via INTRAVENOUS
  Filled 2019-03-22: qty 1

## 2019-03-22 MED ORDER — LIDOCAINE HCL (PF) 1 % IJ SOLN
5.0000 mL | Freq: Once | INTRAMUSCULAR | Status: AC
  Administered 2019-03-22: 5 mL
  Filled 2019-03-22: qty 5

## 2019-03-22 MED ORDER — SODIUM CHLORIDE 0.9 % IV BOLUS
500.0000 mL | Freq: Once | INTRAVENOUS | Status: AC
  Administered 2019-03-22: 22:00:00 500 mL via INTRAVENOUS

## 2019-03-22 MED ORDER — ONDANSETRON HCL 4 MG/2ML IJ SOLN
4.0000 mg | Freq: Once | INTRAMUSCULAR | Status: AC
  Administered 2019-03-22: 4 mg via INTRAVENOUS
  Filled 2019-03-22: qty 2

## 2019-03-22 NOTE — Discharge Instructions (Addendum)
Call Dr. Jeannie Fend from hand surgery information provided above for follow-up.  Call tomorrow to set up the follow-up appointment.  Use the sling for comfort.  Keep the splint dry.  Elevate the right hand at all times.  Take the pain medicine as directed.  Also appears that there is early pregnancy.  Make an appointment to follow-up with OB/GYN.
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
## 2019-03-28 ENCOUNTER — Encounter (HOSPITAL_COMMUNITY): Payer: Self-pay | Admitting: *Deleted

## 2019-03-28 ENCOUNTER — Other Ambulatory Visit: Payer: Self-pay

## 2019-03-28 NOTE — Progress Notes (Signed)
Pt denies SOB, chest pain, and being under the care of a cardiologist. Pt denies having a PCP and OB GYN. Pt stated " I am only 2 weeks, the baby does not have a heart beat yet, I will see someone but I am not sure who. "  Pt denies having a stress test, echo and cardiac cath.  Pt denies having an EKG and chest x ray in the last year. Pt made aware to stop taking Aspirin (unless otherwise advised by surgeon), vitamins, fish oil and herbal medications. Do not take any NSAIDs ie: Ibuprofen, Advil, Naproxen (Aleve), Motrin, BC and Goody Powder. Pt verbalized understanding of all pre-op instructions.
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
## 2019-03-28 NOTE — Progress Notes (Signed)
Error
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
## 2019-03-29 ENCOUNTER — Ambulatory Visit (HOSPITAL_COMMUNITY): Payer: Medicare Other | Admitting: Vascular Surgery

## 2019-03-29 ENCOUNTER — Other Ambulatory Visit: Payer: Self-pay

## 2019-03-29 ENCOUNTER — Other Ambulatory Visit (HOSPITAL_COMMUNITY)
Admission: RE | Admit: 2019-03-29 | Discharge: 2019-03-29 | Disposition: A | Discharge status: Home / Self Care | Payer: Medicare Other | Source: Ambulatory Visit | Attending: Orthopaedic Surgery | Admitting: Orthopaedic Surgery

## 2019-03-29 ENCOUNTER — Encounter (HOSPITAL_COMMUNITY): Payer: Self-pay | Admitting: *Deleted

## 2019-03-29 ENCOUNTER — Encounter (HOSPITAL_COMMUNITY)
Admission: RE | Disposition: A | Discharge status: Home / Self Care | Payer: Self-pay | Source: Home / Self Care | Attending: Orthopaedic Surgery

## 2019-03-29 ENCOUNTER — Ambulatory Visit (HOSPITAL_COMMUNITY)
Admission: RE | Admit: 2019-03-29 | Discharge: 2019-03-29 | Disposition: A | Discharge status: Home / Self Care | Payer: Medicare Other | Attending: Orthopaedic Surgery | Admitting: Orthopaedic Surgery

## 2019-03-29 DIAGNOSIS — S62616A Displaced fracture of proximal phalanx of right little finger, initial encounter for closed fracture: Secondary | ICD-10-CM | POA: Diagnosis not present

## 2019-03-29 DIAGNOSIS — F410 Panic disorder [episodic paroxysmal anxiety] without agoraphobia: Secondary | ICD-10-CM | POA: Insufficient documentation

## 2019-03-29 DIAGNOSIS — F319 Bipolar disorder, unspecified: Secondary | ICD-10-CM | POA: Insufficient documentation

## 2019-03-29 DIAGNOSIS — Z20828 Contact with and (suspected) exposure to other viral communicable diseases: Secondary | ICD-10-CM | POA: Diagnosis not present

## 2019-03-29 DIAGNOSIS — S62610A Displaced fracture of proximal phalanx of right index finger, initial encounter for closed fracture: Secondary | ICD-10-CM | POA: Diagnosis present

## 2019-03-29 DIAGNOSIS — S62336A Displaced fracture of neck of fifth metacarpal bone, right hand, initial encounter for closed fracture: Secondary | ICD-10-CM | POA: Diagnosis not present

## 2019-03-29 DIAGNOSIS — Z87891 Personal history of nicotine dependence: Secondary | ICD-10-CM | POA: Diagnosis not present

## 2019-03-29 DIAGNOSIS — F4310 Post-traumatic stress disorder, unspecified: Secondary | ICD-10-CM | POA: Insufficient documentation

## 2019-03-29 DIAGNOSIS — Z79899 Other long term (current) drug therapy: Secondary | ICD-10-CM | POA: Diagnosis not present

## 2019-03-29 HISTORY — DX: Unspecified multiple injuries, initial encounter: T07.XXXA

## 2019-03-29 HISTORY — DX: Personal history of urinary calculi: Z87.442

## 2019-03-29 HISTORY — PX: OPEN REDUCTION INTERNAL FIXATION (ORIF) METACARPAL: SHX6234

## 2019-03-29 LAB — SARS CORONAVIRUS 2 BY RT PCR (HOSPITAL ORDER, PERFORMED IN ~~LOC~~ HOSPITAL LAB): SARS Coronavirus 2: NEGATIVE

## 2019-03-29 LAB — SURGICAL PCR SCREEN
MRSA, PCR: NEGATIVE
Staphylococcus aureus: NEGATIVE

## 2019-03-29 LAB — HCG, QUANTITATIVE, PREGNANCY: hCG, Beta Chain, Quant, S: 545 m[IU]/mL — ABNORMAL HIGH (ref <5)

## 2019-03-29 SURGERY — OPEN REDUCTION INTERNAL FIXATION (ORIF) METACARPAL
Anesthesia: Monitor Anesthesia Care | Site: Finger | Laterality: Right

## 2019-03-29 MED ORDER — PROPOFOL 10 MG/ML IV BOLUS
INTRAVENOUS | Status: DC | PRN
  Administered 2019-03-29 (×3): 50 mg via INTRAVENOUS

## 2019-03-29 MED ORDER — BUPIVACAINE HCL (PF) 0.25 % IJ SOLN
INTRAMUSCULAR | Status: AC
  Filled 2019-03-29: qty 30

## 2019-03-29 MED ORDER — ENSURE PRE-SURGERY PO LIQD: 296.0000 mL | Freq: Once | ORAL | Status: DC

## 2019-03-29 MED ORDER — HYDROCODONE-ACETAMINOPHEN 5-325 MG PO TABS: 1.0000 | ORAL_TABLET | Freq: Four times a day (QID) | ORAL | 0 refills | Status: DC | PRN

## 2019-03-29 MED ORDER — CEFAZOLIN SODIUM-DEXTROSE 2-4 GM/100ML-% IV SOLN: 2.0000 g | INTRAVENOUS | Status: DC

## 2019-03-29 MED ORDER — ROPIVACAINE HCL 7.5 MG/ML IJ SOLN
INTRAMUSCULAR | Status: DC | PRN
  Administered 2019-03-29: 30 mL via PERINEURAL

## 2019-03-29 MED ORDER — CEFAZOLIN SODIUM-DEXTROSE 2-4 GM/100ML-% IV SOLN
INTRAVENOUS | Status: AC
  Filled 2019-03-29: qty 100

## 2019-03-29 MED ORDER — LACTATED RINGERS IV SOLN
INTRAVENOUS | Status: DC
  Administered 2019-03-29: 13:00:00 via INTRAVENOUS

## 2019-03-29 MED ORDER — PROPOFOL 500 MG/50ML IV EMUL
INTRAVENOUS | Status: DC | PRN
  Administered 2019-03-29: 100 ug/kg/min via INTRAVENOUS

## 2019-03-29 MED ORDER — DEXMEDETOMIDINE HCL IN NACL 200 MCG/50ML IV SOLN
INTRAVENOUS | Status: DC | PRN
  Administered 2019-03-29 (×2): 8 ug via INTRAVENOUS

## 2019-03-29 MED ORDER — BUPIVACAINE HCL (PF) 0.25 % IJ SOLN
INTRAMUSCULAR | Status: DC | PRN
  Administered 2019-03-29: 10 mL

## 2019-03-29 MED ORDER — 0.9 % SODIUM CHLORIDE (POUR BTL) OPTIME
TOPICAL | Status: DC | PRN
  Administered 2019-03-29: 1000 mL

## 2019-03-29 MED ORDER — FENTANYL CITRATE (PF) 100 MCG/2ML IJ SOLN
INTRAMUSCULAR | Status: AC
  Filled 2019-03-29: qty 2

## 2019-03-29 MED ORDER — LACTATED RINGERS IV SOLN
INTRAVENOUS | Status: DC | PRN
  Administered 2019-03-29 (×2): via INTRAVENOUS

## 2019-03-29 SURGICAL SUPPLY — 68 items
BIT DRILL 1.1X3.5 QK RELEA (DRILL) IMPLANT
BIT DRILL 1.1X3.5 QUICK RELEAS (DRILL) ×6
BNDG CMPR 9X4 STRL LF SNTH (GAUZE/BANDAGES/DRESSINGS) ×1
BNDG ELASTIC 3X5.8 VLCR STR LF (GAUZE/BANDAGES/DRESSINGS) ×3 IMPLANT
BNDG ELASTIC 4X5.8 VLCR STR LF (GAUZE/BANDAGES/DRESSINGS) ×3 IMPLANT
BNDG ESMARK 4X9 LF (GAUZE/BANDAGES/DRESSINGS) ×3 IMPLANT
BNDG GAUZE ELAST 4 BULKY (GAUZE/BANDAGES/DRESSINGS) ×3 IMPLANT
CANISTER SUCT 3000ML PPV (MISCELLANEOUS) ×3 IMPLANT
CORD BIPOLAR FORCEPS 12FT (ELECTRODE) ×3 IMPLANT
COVER SURGICAL LIGHT HANDLE (MISCELLANEOUS) ×3 IMPLANT
COVER WAND RF STERILE (DRAPES) IMPLANT
CUFF TOURN SGL QUICK 18X4 (TOURNIQUET CUFF) ×3 IMPLANT
CUFF TOURN SGL QUICK 24 (TOURNIQUET CUFF)
CUFF TRNQT CYL 24X4X16.5-23 (TOURNIQUET CUFF) IMPLANT
DRAPE OEC MINIVIEW 54X84 (DRAPES) ×3 IMPLANT
DRAPE SURG 17X23 STRL (DRAPES) ×3 IMPLANT
DRSG XEROFORM 1X8 (GAUZE/BANDAGES/DRESSINGS) ×2 IMPLANT
GAUZE SPONGE 4X4 12PLY STRL (GAUZE/BANDAGES/DRESSINGS) ×3 IMPLANT
GAUZE XEROFORM 1X8 LF (GAUZE/BANDAGES/DRESSINGS) ×3 IMPLANT
GLOVE INDICATOR 8.0 STRL GRN (GLOVE) ×3 IMPLANT
GLOVE SURG SYN 7.5  E (GLOVE) ×2
GLOVE SURG SYN 7.5 E (GLOVE) ×1 IMPLANT
GLOVE SURG SYN 7.5 PF PI (GLOVE) ×1 IMPLANT
GOWN STRL REUS W/ TWL LRG LVL3 (GOWN DISPOSABLE) ×2 IMPLANT
GOWN STRL REUS W/TWL LRG LVL3 (GOWN DISPOSABLE) ×6
GUIDEWIRE ORTHO MINI ACTK .045 (WIRE) ×4 IMPLANT
KIT BASIN OR (CUSTOM PROCEDURE TRAY) ×3 IMPLANT
KIT TURNOVER KIT B (KITS) ×3 IMPLANT
NEEDLE 22X1 1/2 (OR ONLY) (NEEDLE) IMPLANT
NS IRRIG 1000ML POUR BTL (IV SOLUTION) ×3 IMPLANT
PACK ORTHO EXTREMITY (CUSTOM PROCEDURE TRAY) ×3 IMPLANT
PAD ARMBOARD 7.5X6 YLW CONV (MISCELLANEOUS) ×6 IMPLANT
PAD CAST 3X4 CTTN HI CHSV (CAST SUPPLIES) ×1 IMPLANT
PAD CAST 4YDX4 CTTN HI CHSV (CAST SUPPLIES) ×1 IMPLANT
PADDING CAST COTTON 3X4 STRL (CAST SUPPLIES) ×3
PADDING CAST COTTON 4X4 STRL (CAST SUPPLIES) ×3
PADDING CAST SYNTHETIC 4 (CAST SUPPLIES) ×2
PADDING CAST SYNTHETIC 4X4 STR (CAST SUPPLIES) IMPLANT
PLATE BONE 3H T .8MM NS (Plate) IMPLANT
PLATE BONE T .8MM NS (Plate) ×6 IMPLANT
SCREW 1.5X7MM NON LOCKING (Screw) ×2 IMPLANT
SCREW BONE LAG 1.5X11MM HEXA (Screw) IMPLANT
SCREW BONE MD 1.5X10MM HEXA (Screw) IMPLANT
SCREW BONE MD 1.5X11MM HEXA (Screw) IMPLANT
SCREW BONE MD 1.5X13MM HEXA (Screw) IMPLANT
SCREW BONE MD 1.5X9MM HEXA (Screw) IMPLANT
SCREW BONE MD LCKG 1.5X6MM HEX (Screw) IMPLANT
SCREW LAG HEXALOBE 1.5X11 (Screw) ×3 IMPLANT
SCREW LOCKING 1.5X8MM (Screw) ×2 IMPLANT
SCREW MD 1.5X10MM (Screw) ×6 IMPLANT
SCREW MD 1.5X11MM (Screw) ×3 IMPLANT
SCREW MD 1.5X13MM (Screw) ×3 IMPLANT
SCREW MD 1.5X9MM (Screw) ×6 IMPLANT
SCREW MD LOCKING 1.5X6MM (Screw) ×3 IMPLANT
SCREW MULTI HEXALOBE 1.5X7 (Screw) ×4 IMPLANT
SCREW NONLOCKING 1.5X8MM (Screw) ×2 IMPLANT
SOL PREP POV-IOD 4OZ 10% (MISCELLANEOUS) ×6 IMPLANT
SPLINT FIBERGLASS 4X30 (CAST SUPPLIES) ×2 IMPLANT
SUT MERSILENE 5 0 GS 24 (SUTURE) ×2 IMPLANT
SUT PROLENE 4 0 PS 2 18 (SUTURE) ×3 IMPLANT
SYR CONTROL 10ML LL (SYRINGE) IMPLANT
TAPE CLOTH 1X10 TAN NS (GAUZE/BANDAGES/DRESSINGS) ×2 IMPLANT
TOWEL GREEN STERILE (TOWEL DISPOSABLE) ×3 IMPLANT
TOWEL GREEN STERILE FF (TOWEL DISPOSABLE) ×3 IMPLANT
TUBE CONNECTING 12'X1/4 (SUCTIONS) ×1
TUBE CONNECTING 12X1/4 (SUCTIONS) ×2 IMPLANT
UNDERPAD 30X30 (UNDERPADS AND DIAPERS) ×3 IMPLANT
WATER STERILE IRR 1000ML POUR (IV SOLUTION) ×3 IMPLANT
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
## 2019-03-29 NOTE — Progress Notes (Signed)
Orthopedic Tech Progress Note Patient Details:  Lydia Cochran 05/28/1991 [id number] PACU RN called requesting an arm sling for patient  Ortho Devices Type of Ortho Device: Arm sling Ortho Device/Splint Interventions: Other (comment), Adjustment, Application, Ordered   Post Interventions Patient Tolerated: Well Instructions Provided: Care of device, Adjustment of device   Janit Pagan 03/29/2019, 5:10 PM

## 2019-03-29 NOTE — Anesthesia Procedure Notes (Signed)
Anesthesia Regional Block: Axillary brachial plexus block   Pre-Anesthetic Checklist: ,, timeout performed, Correct Patient, Correct Site, Correct Laterality, Correct Procedure, Correct Position, site marked, Risks and benefits discussed,  Surgical consent,  Pre-op evaluation,  At surgeon's request and post-op pain management  Laterality: Right  Prep: chloraprep       Needles:  Injection technique: Single-shot  Needle Type: Echogenic Needle     Needle Length: 9cm  Needle Gauge: 21     Additional Needles:   Procedures:,,,, ultrasound used (permanent image in chart),,,,  Narrative:  Start time: 03/29/2019 1:55 PM End time: 03/29/2019 2:05 PM Injection made incrementally with aspirations every 5 mL.  Performed by: Personally  Anesthesiologist: Catalina Gravel, MD  Additional Notes: No pain on injection. No increased resistance to injection. Injection made in 5cc increments.  Good needle visualization.  Patient tolerated procedure well.
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
## 2019-03-29 NOTE — Discharge Instructions (Signed)
Discharge Instructions  - Keep dressings in place. Do not remove them. - The dressings must stay dry - Take all medication as prescribed. Transition to over the counter pain medication as your pain improves - Keep the hand elevated over the next 48-72 hours to help with pain and swelling - Move all digits not restricted by the dressings regularly to prevent stiffness - Please call to schedule a follow up appointment with Dr. Vonita Calloway and therapy at [phone] for 7 days following surgery - Your pain medication have been send digitally to your pharmacy  
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
## 2019-03-29 NOTE — Anesthesia Postprocedure Evaluation (Signed)
Anesthesia Post Note  Patient: Lydia Cochran  Procedure(s) Performed: Right hand fracture open reduction and surgical fixation as indicated,open treatment of phalangeal shagt fracture and open treatment of metacarpal fracture (Right Finger)     Patient location during evaluation: PACU Anesthesia Type: Regional Level of consciousness: awake and alert Pain management: pain level controlled Vital Signs Assessment: post-procedure vital signs reviewed and stable Respiratory status: spontaneous breathing, nonlabored ventilation and respiratory function stable Cardiovascular status: stable and blood pressure returned to baseline Postop Assessment: no apparent nausea or vomiting Anesthetic complications: no    Last Vitals:  Vitals:   03/29/19 1622 03/29/19 1637  BP: (!) 112/57 99/60  Pulse: 88   Resp: 17   Temp: 36.7 C   SpO2: 96%     Last Pain:  Vitals:   03/29/19 1645  TempSrc:   PainSc: 0-No pain                 Catalina Gravel

## 2019-03-29 NOTE — Anesthesia Procedure Notes (Signed)
Procedure Name: MAC Date/Time: 03/29/2019 2:20 PM Performed by: Eligha Bridegroom, CRNA Pre-anesthesia Checklist: Patient identified, Emergency Drugs available, Suction available, Patient being monitored and Timeout performed Patient Re-evaluated:Patient Re-evaluated prior to induction Oxygen Delivery Method: Nasal cannula Preoxygenation: Pre-oxygenation with 100% oxygen Induction Type: IV induction

## 2019-03-29 NOTE — Transfer of Care (Signed)
Immediate Anesthesia Transfer of Care Note  Patient: Lydia Cochran  Procedure(s) Performed: Right hand fracture open reduction and surgical fixation as indicated,open treatment of phalangeal shagt fracture and open treatment of metacarpal fracture (Right Finger)  Patient Location: PACU  Anesthesia Type:MAC combined with regional for post-op pain  Level of Consciousness: sedated  Airway & Oxygen Therapy: Patient Spontanous Breathing and Patient connected to nasal cannula oxygen  Post-op Assessment: Report given to RN and Post -op Vital signs reviewed and stable  Post vital signs: Reviewed and stable  Last Vitals:  Vitals Value Taken Time  BP 112/57 03/29/19 1622  Temp    Pulse 84 03/29/19 1625  Resp 17 03/29/19 1625  SpO2 96 % 03/29/19 1625  Vitals shown include unvalidated device data.  Last Pain:  Vitals:   03/29/19 1622  TempSrc:   PainSc: (P) Asleep      Patients Stated Pain Goal: 3 (51/70/01 7494)  Complications: No apparent anesthesia complications

## 2019-04-02 ENCOUNTER — Encounter (HOSPITAL_COMMUNITY): Payer: Self-pay | Admitting: Orthopaedic Surgery

## 2019-05-22 ENCOUNTER — Inpatient Hospital Stay (HOSPITAL_COMMUNITY): Payer: Medicare Other

## 2019-05-22 ENCOUNTER — Ambulatory Visit (INDEPENDENT_AMBULATORY_CARE_PROVIDER_SITE_OTHER): Payer: Medicare Other | Admitting: Certified Nurse Midwife

## 2019-05-22 ENCOUNTER — Other Ambulatory Visit: Payer: Self-pay

## 2019-05-22 ENCOUNTER — Inpatient Hospital Stay (HOSPITAL_COMMUNITY)
Admission: AD | Admit: 2019-05-22 | Discharge: 2019-05-23 | DRG: 770 | Disposition: A | Discharge status: Home / Self Care | Payer: Medicare Other | Attending: Obstetrics and Gynecology | Admitting: Obstetrics and Gynecology

## 2019-05-22 ENCOUNTER — Encounter (HOSPITAL_COMMUNITY): Payer: Self-pay | Admitting: *Deleted

## 2019-05-22 ENCOUNTER — Encounter: Payer: Self-pay | Admitting: Certified Nurse Midwife

## 2019-05-22 VITALS — BP 93/56 | HR 81

## 2019-05-22 DIAGNOSIS — O039 Complete or unspecified spontaneous abortion without complication: Secondary | ICD-10-CM

## 2019-05-22 DIAGNOSIS — O0289 Other abnormal products of conception: Secondary | ICD-10-CM

## 2019-05-22 DIAGNOSIS — Z3A10 10 weeks gestation of pregnancy: Secondary | ICD-10-CM

## 2019-05-22 DIAGNOSIS — O021 Missed abortion: Secondary | ICD-10-CM

## 2019-05-22 DIAGNOSIS — R109 Unspecified abdominal pain: Secondary | ICD-10-CM

## 2019-05-22 DIAGNOSIS — O26899 Other specified pregnancy related conditions, unspecified trimester: Secondary | ICD-10-CM

## 2019-05-22 DIAGNOSIS — O200 Threatened abortion: Secondary | ICD-10-CM

## 2019-05-22 DIAGNOSIS — Z20828 Contact with and (suspected) exposure to other viral communicable diseases: Secondary | ICD-10-CM | POA: Diagnosis present

## 2019-05-22 DIAGNOSIS — Z87891 Personal history of nicotine dependence: Secondary | ICD-10-CM

## 2019-05-22 DIAGNOSIS — N939 Abnormal uterine and vaginal bleeding, unspecified: Secondary | ICD-10-CM

## 2019-05-22 LAB — CBC
HCT: 32.8 % — ABNORMAL LOW (ref 36.0–46.0)
Hemoglobin: 11.2 g/dL — ABNORMAL LOW (ref 12.0–15.0)
MCH: 30.7 pg (ref 26.0–34.0)
MCHC: 34.1 g/dL (ref 30.0–36.0)
MCV: 89.9 fL (ref 80.0–100.0)
Platelets: 257 10*3/uL (ref 150–400)
RBC: 3.65 MIL/uL — ABNORMAL LOW (ref 3.87–5.11)
RDW: 12.7 % (ref 11.5–15.5)
WBC: 16.2 10*3/uL — ABNORMAL HIGH (ref 4.0–10.5)
nRBC: 0 % (ref 0.0–0.2)

## 2019-05-22 LAB — HCG, QUANTITATIVE, PREGNANCY: hCG, Beta Chain, Quant, S: 914 m[IU]/mL — ABNORMAL HIGH (ref <5)

## 2019-05-22 MED ORDER — HYDROMORPHONE HCL 1 MG/ML IJ SOLN
1.0000 mg | INTRAMUSCULAR | Status: DC | PRN
  Administered 2019-05-22: 1 mg via INTRAMUSCULAR
  Filled 2019-05-22: qty 1

## 2019-05-22 MED ORDER — HYDROMORPHONE HCL 2 MG PO TABS
1.0000 mg | ORAL_TABLET | Freq: Once | ORAL | Status: DC
  Filled 2019-05-22: qty 1

## 2019-05-22 MED ORDER — ONDANSETRON 4 MG PO TBDP
4.0000 mg | ORAL_TABLET | Freq: Once | ORAL | Status: DC
  Filled 2019-05-22: qty 1

## 2019-05-22 MED ORDER — PROMETHAZINE HCL 25 MG/ML IJ SOLN
25.0000 mg | Freq: Once | INTRAMUSCULAR | Status: AC
  Administered 2019-05-22: 25 mg via INTRAMUSCULAR
  Filled 2019-05-22: qty 1

## 2019-05-22 MED ORDER — IBUPROFEN 800 MG PO TABS
800.0000 mg | ORAL_TABLET | Freq: Once | ORAL | Status: DC
  Filled 2019-05-22: qty 1

## 2019-05-22 MED ORDER — KETOROLAC TROMETHAMINE 30 MG/ML IJ SOLN: 30.0000 mg | Freq: Once | INTRAMUSCULAR | Status: DC

## 2019-05-22 NOTE — Progress Notes (Signed)
Patient presents to office for NOB visit. Bedside US notes missed AB. Patient tearful and frustrated that this continues to happen. This is the patient's 5th pregnancy she has lost.   Reports abdominal cramping and vaginal bleeding started occurring last night while she was at work.  Educated and discussed options for patient with expectant management vs D&C, unable to have cytotec d/t size of fetus. Patient adamant that she wants a D&C but does not want to have to wait 3 weeks for the procedure to occur last time. Discussed with patient that after procedure is performed - we can schedule time to discuss future options and come up with plan. Patient reports having autoimmune labs after this occurred the second time. Educated patient on progesterone in the future if labs continue to be normal.   Patient scheduled to return to the office on Thursday for consent and scheduling of procedure. Patient verbalizes understanding. Support given to patient.   Lajean Manes, CNM 05/22/19, 12:40 PM

## 2019-05-22 NOTE — MAU Note (Signed)
Pt sent from office for missed AB and increased pain.

## 2019-05-22 NOTE — MAU Note (Signed)
Pt is upset because she wants to have a D&C for her demise. She refused the pain medication that I brought to her, and continued to yell after I told her I would ask the provider Mathis Dad to come back and talk with her about the current care plan that she was not happy with. She continued yelling. Dr Nehemiah Settle and security are notified.

## 2019-05-22 NOTE — MAU Provider Note (Addendum)
History     CSN: [id number]  Arrival date and time: 05/22/19 1725   None     Chief Complaint  Patient presents with  . Vaginal Bleeding  . Abdominal Pain   HPI Lydia Cochran is a 28 y.o. [id number] at [redacted]w[redacted]d with missed AB, bleeding and severe abdominal pain. Patient endorses new onset vaginal spotting this morning at 0230. She presented to [place] where her missed AB was confirmed. A plan was made for [facility] follow-up Thursday 05/24/19 but patient began bleeding heavily and experienced new onset 10/10 abdominal cramping after leaving her clinic appointment. She made contact with clinic staff and was encouraged to present to MAU for management and evaluation.  Patient endorses bleeding all over her pants and car upon arriving to MAU. Her pain is suprapubic, 10/10, non-radiating and accompanied by a sense of fullness in her vagina. She is extremely tearful and writhing in bed.  OB History    Gravida  8   Para  1   Term  1   Preterm      AB  6   Living  1     SAB  5   TAB  1   Ectopic      Multiple      Live Births  1           Past Medical History:  Diagnosis Date  . Anxiety   . Bipolar affective ([place])   . Fractures    right hand  . History of kidney stones   . Intermittent explosive disorder   . Panic attack   . PTSD (post-traumatic stress disorder)     Past Surgical History:  Procedure Laterality Date  . BREAST ENHANCEMENT SURGERY    . CESAREAN SECTION    . DILATION AND CURETTAGE OF UTERUS    . OPEN REDUCTION INTERNAL FIXATION (ORIF) METACARPAL Right 03/29/2019   Procedure: Right hand fracture open reduction and surgical fixation as indicated,open treatment of phalangeal shagt fracture and open treatment of metacarpal fracture;  Surgeon: Verner Mould, MD;  Location: [facility];  Service: Orthopedics;  Laterality: Right;  with regional block    Family History  Problem Relation Age of Onset  . Brain cancer Father   . Lupus Mother      Social History   Tobacco Use  . Smoking status: Former Smoker    Types: Cigars  . Smokeless tobacco: Never Used  Substance Use Topics  . Alcohol use: No  . Drug use: Not Currently    Types: Marijuana    Allergies:  Allergies  Allergen Reactions  . Delsym  [Dextromethorphan Polistirex Er] Itching  . Chocolate Flavor Nausea And Vomiting  . Darvocet [Propoxyphene N-Acetaminophen] Itching and Nausea And Vomiting  . Morphine Itching    N/V.  Can take Hydrocodone and Oxycodone  . Tape     Plastic tape gives her a reaction. Paper tape must be used.   . Tramadol Nausea And Vomiting    Medications Prior to Admission  Medication Sig Dispense Refill Last Dose  . albuterol (VENTOLIN HFA) 108 (90 Base) MCG/ACT inhaler Inhale 2 puffs into the lungs every 6 (six) hours as needed for wheezing or shortness of breath.     Marland Kitchen HYDROcodone-acetaminophen (NORCO) 5-325 MG tablet Take 1-2 tablets by mouth every 6 (six) hours as needed for moderate pain. 35 tablet 0   . HYDROcodone-acetaminophen (NORCO/VICODIN) 5-325 MG tablet Take 1-2 tablets by mouth every 6 (six) hours as needed for  moderate pain. 35 tablet 0   . lamivudine (EPIVIR) 100 MG tablet Take 100 mg by mouth 2 (two) times daily.     Marland Kitchen. lamoTRIgine (LAMICTAL) 100 MG tablet Take 100 mg by mouth daily.     . QUEtiapine (SEROQUEL) 50 MG tablet Take 50 mg by mouth at bedtime.      . sertraline (ZOLOFT) 100 MG tablet Take 200 mg by mouth daily.        Review of Systems  Constitutional: Negative for chills, fatigue and fever.  Respiratory: Negative for shortness of breath.   Gastrointestinal: Positive for abdominal pain.  Genitourinary: Positive for vaginal bleeding. Negative for difficulty urinating and dysuria.  Musculoskeletal: Negative for back pain.  Neurological: Negative for dizziness, syncope and weakness.  All other systems reviewed and are negative.  Physical Exam   Blood pressure (!) 106/51, pulse 93, resp. rate 18, last  menstrual period 03/09/2019.  Physical Exam  Nursing note and vitals reviewed. Constitutional: She is oriented to person, place, and time. She appears well-developed and well-nourished.  Cardiovascular: Normal rate.  Respiratory: Effort normal and breath sounds normal.  GI: Soft. She exhibits no distension. There is no abdominal tenderness. There is no rebound and no guarding.  Genitourinary:    Genitourinary Comments: Moderate blood with large clot visualized on pelvic exam. Clot removed with assistance of gentle manipulation of speculum and involuntary maternal effort. Scant bleeding afterwards   Neurological: She is alert and oriented to person, place, and time.  Skin: Skin is warm and dry.  Psychiatric: She has a normal mood and affect. Her behavior is normal. Judgment and thought content normal.    MAU Course/MDM  Procedures  --At bedside at 7:50pm to check on patient. She is sleeping soundly. Reports pain score of 10/10. Continues to endorses significant vaginal pressure  Patient Vitals for the past 24 hrs:  BP Pulse Resp  05/22/19 2118 (!) 96/50 64 -  05/22/19 1749 (!) 106/51 93 18   Results for orders placed or performed during the hospital encounter of 05/22/19 (from the past 24 hour(s))  CBC     Status: Abnormal   Collection Time: 05/22/19  6:06 PM  Result Value Ref Range   WBC 16.2 (H) 4.0 - 10.5 K/uL   RBC 3.65 (L) 3.87 - 5.11 MIL/uL   Hemoglobin 11.2 (L) 12.0 - 15.0 g/dL   HCT 11.932.8 (L) 14.736.0 - 82.946.0 %   MCV 89.9 80.0 - 100.0 fL   MCH 30.7 26.0 - 34.0 pg   MCHC 34.1 30.0 - 36.0 g/dL   RDW 56.212.7 13.011.5 - 86.515.5 %   Platelets 257 150 - 400 K/uL   nRBC 0.0 0.0 - 0.2 %  hCG, quantitative, pregnancy     Status: Abnormal   Collection Time: 05/22/19  6:06 PM  Result Value Ref Range   hCG, Beta Chain, Quant, S 914 (H) <5 mIU/mL   [place] Ob Comp Less 14 Wks  Result Date: 05/22/2019 CLINICAL DATA:  Pregnant patient in first-trimester pregnancy with vaginal bleeding and abdominal  cramping. Nonviable pregnancy. EXAM: OBSTETRIC <14 WK ULTRASOUND TECHNIQUE: Transabdominal ultrasound was performed for evaluation of the gestation as well as the maternal uterus and adnexal regions. COMPARISON:  None available this pregnancy. FINDINGS: Intrauterine gestational sac: Single Yolk sac:  Visualized. Embryo:  Visualized. Cardiac Activity: Not Visualized. Heart Rate: 0 bpm CRL:   30.1 mm   9 w 6 d Subchorionic hemorrhage:  None visualized. Maternal uterus/adnexae: Gestational sac noted in the lower uterine  segment. Small amount of fluid in the cervical canal. Neither ovary was visualized, no gross adnexal mass. IMPRESSION: Nonviable intrauterine pregnancy at 9 weeks 6 days by crown-rump length. Absent fetal heart tones consistent with fetal demise. Electronically Signed   By: Narda Rutherford M.D.   On: 05/22/2019 19:16   Report given to H. Kharlie Bring, DO who assumes care of patient at this time.  Clayton Bibles, MSN, CNM Certified Nurse Midwife, [facility] for [employer], [facility] 05/22/19 9:21 PM   Assumed care from Clayton Bibles, MSN, CNM  11:30 PM -Upon recheck, patient had some increased bleeding and pain -Repeat speculum exam shows large 2-3 cm clots in the vaginal vault. Cervical Os is open. -Patient refused Ibuprofen and PO Dilaudid -Dr. Adrian Blackwater called to bedside to evaluate as patient "refuses to leave without a D&C" Assessment and Plan  28 yo [id number] at [redacted]w[redacted]d with missed AB, bleeding and severe abdominal pain -Dr. Adrian Blackwater called to evaluate, will admit for [facility] in the AM  Brand Siever, Margarette Asal, DO OB Fellow, Faculty Practice 05/23/2019 12:21 AM

## 2019-05-22 NOTE — Progress Notes (Addendum)
Bedside U/S shows single IUP  With no FHT noted.  CRL measures 31.11mm  GA [redacted]w[redacted]d.  When I finished U/S patient jumped off table and started to put her clothes on.  I told her she needed to stay undressed until she was seen by the the provider.  She started yelling at me that I was a "heartless bitch" and I didn't care anything about her and that her baby was dead.  I was just using her as a [place] pig since I had a student with me.  She told me to get out of her "fucking face" and stated that I said she didn't deserve a baby.  I never even thought that or never said that.  I told her that if she didn't calm down I would call security.  She continue yelling and said all she wanted was to have a D&C because she had a dead baby in her.  At this time Wende Bushy, CNM came into the room and told the patient she needed to calm down and security was called to have a presence in the office.

## 2019-05-23 ENCOUNTER — Inpatient Hospital Stay (HOSPITAL_COMMUNITY): Payer: Medicare Other

## 2019-05-23 ENCOUNTER — Inpatient Hospital Stay (HOSPITAL_COMMUNITY): Payer: Medicare Other | Admitting: Certified Registered Nurse Anesthetist

## 2019-05-23 ENCOUNTER — Encounter (HOSPITAL_COMMUNITY): Payer: Self-pay

## 2019-05-23 ENCOUNTER — Encounter (HOSPITAL_COMMUNITY)
Admission: AD | Disposition: A | Discharge status: Home / Self Care | Payer: Self-pay | Source: Home / Self Care | Attending: Family Medicine

## 2019-05-23 DIAGNOSIS — R109 Unspecified abdominal pain: Secondary | ICD-10-CM | POA: Diagnosis present

## 2019-05-23 DIAGNOSIS — O021 Missed abortion: Secondary | ICD-10-CM | POA: Diagnosis present

## 2019-05-23 DIAGNOSIS — Z20828 Contact with and (suspected) exposure to other viral communicable diseases: Secondary | ICD-10-CM | POA: Diagnosis present

## 2019-05-23 DIAGNOSIS — Z87891 Personal history of nicotine dependence: Secondary | ICD-10-CM | POA: Diagnosis not present

## 2019-05-23 DIAGNOSIS — Z3A10 10 weeks gestation of pregnancy: Secondary | ICD-10-CM | POA: Diagnosis not present

## 2019-05-23 HISTORY — PX: DILATION AND EVACUATION: SHX1459

## 2019-05-23 LAB — URINALYSIS, ROUTINE W REFLEX MICROSCOPIC
Bilirubin Urine: NEGATIVE
Glucose, UA: NEGATIVE mg/dL
Ketones, ur: 5 mg/dL — AB
Nitrite: NEGATIVE
Protein, ur: NEGATIVE mg/dL
RBC / HPF: >50 RBC/hpf — ABNORMAL HIGH (ref 0–5)
Specific Gravity, Urine: 1.011 (ref 1.005–1.030)
pH: 8 (ref 5.0–8.0)

## 2019-05-23 LAB — CBC
HCT: 31.7 % — ABNORMAL LOW (ref 36.0–46.0)
Hemoglobin: 10.6 g/dL — ABNORMAL LOW (ref 12.0–15.0)
MCH: 30.5 pg (ref 26.0–34.0)
MCHC: 33.4 g/dL (ref 30.0–36.0)
MCV: 91.4 fL (ref 80.0–100.0)
Platelets: 254 10*3/uL (ref 150–400)
RBC: 3.47 MIL/uL — ABNORMAL LOW (ref 3.87–5.11)
RDW: 12.7 % (ref 11.5–15.5)
WBC: 8.5 10*3/uL (ref 4.0–10.5)
nRBC: 0 % (ref 0.0–0.2)

## 2019-05-23 LAB — TYPE AND SCREEN
ABO/RH(D): O POS
Antibody Screen: NEGATIVE

## 2019-05-23 LAB — RAPID URINE DRUG SCREEN, HOSP PERFORMED
Amphetamines: NOT DETECTED
Barbiturates: NOT DETECTED
Benzodiazepines: POSITIVE — AB
Cocaine: NOT DETECTED
Opiates: POSITIVE — AB
Tetrahydrocannabinol: POSITIVE — AB

## 2019-05-23 LAB — SARS CORONAVIRUS 2 (TAT 6-24 HRS): SARS Coronavirus 2: NEGATIVE

## 2019-05-23 SURGERY — DILATION AND EVACUATION, UTERUS
Anesthesia: Monitor Anesthesia Care | Site: Vagina

## 2019-05-23 MED ORDER — ONDANSETRON HCL 4 MG PO TABS: 4.0000 mg | ORAL_TABLET | Freq: Four times a day (QID) | ORAL | Status: DC | PRN

## 2019-05-23 MED ORDER — LACTATED RINGERS IV SOLN
INTRAVENOUS | Status: DC
  Administered 2019-05-23: 17:00:00 via INTRAVENOUS

## 2019-05-23 MED ORDER — LAMOTRIGINE 100 MG PO TABS
100.0000 mg | ORAL_TABLET | Freq: Every day | ORAL | Status: DC
  Filled 2019-05-23: qty 1

## 2019-05-23 MED ORDER — FAMOTIDINE IN NACL 20-0.9 MG/50ML-% IV SOLN
20.0000 mg | Freq: Two times a day (BID) | INTRAVENOUS | Status: DC
  Filled 2019-05-23: qty 50

## 2019-05-23 MED ORDER — MIDAZOLAM HCL 2 MG/2ML IJ SOLN
INTRAMUSCULAR | Status: DC | PRN
  Administered 2019-05-23: 2 mg via INTRAVENOUS

## 2019-05-23 MED ORDER — SODIUM CHLORIDE 0.9 % IV SOLN
100.0000 mg | Freq: Once | INTRAVENOUS | Status: AC
  Administered 2019-05-23: 17:00:00 100 mg via INTRAVENOUS
  Filled 2019-05-23: qty 100

## 2019-05-23 MED ORDER — SERTRALINE HCL 50 MG PO TABS: 200.0000 mg | ORAL_TABLET | Freq: Every day | ORAL | Status: DC

## 2019-05-23 MED ORDER — ONDANSETRON HCL 4 MG/2ML IJ SOLN
4.0000 mg | Freq: Four times a day (QID) | INTRAMUSCULAR | Status: DC | PRN
  Administered 2019-05-23: 18:00:00 4 mg via INTRAVENOUS

## 2019-05-23 MED ORDER — LORAZEPAM 2 MG/ML IJ SOLN
INTRAMUSCULAR | Status: AC
  Filled 2019-05-23: qty 1

## 2019-05-23 MED ORDER — PROPOFOL 10 MG/ML IV BOLUS
INTRAVENOUS | Status: AC
  Filled 2019-05-23: qty 20

## 2019-05-23 MED ORDER — HYDROMORPHONE HCL 1 MG/ML IJ SOLN
1.0000 mg | INTRAMUSCULAR | Status: DC | PRN
  Administered 2019-05-23 (×6): 1 mg via INTRAVENOUS
  Filled 2019-05-23 (×6): qty 1

## 2019-05-23 MED ORDER — ALBUTEROL SULFATE HFA 108 (90 BASE) MCG/ACT IN AERS
2.0000 | INHALATION_SPRAY | Freq: Four times a day (QID) | RESPIRATORY_TRACT | Status: DC
  Administered 2019-05-23 (×2): 2 via RESPIRATORY_TRACT
  Filled 2019-05-23: qty 6.7

## 2019-05-23 MED ORDER — LORAZEPAM 0.5 MG PO TABS: 1.0000 mg | ORAL_TABLET | ORAL | Status: DC | PRN

## 2019-05-23 MED ORDER — BUPIVACAINE HCL (PF) 0.5 % IJ SOLN
INTRAMUSCULAR | Status: AC
  Filled 2019-05-23: qty 30

## 2019-05-23 MED ORDER — LIDOCAINE 2% (20 MG/ML) 5 ML SYRINGE
INTRAMUSCULAR | Status: DC | PRN
  Administered 2019-05-23: 100 mg via INTRAVENOUS

## 2019-05-23 MED ORDER — ZIPRASIDONE MESYLATE 20 MG IM SOLR
20.0000 mg | Freq: Two times a day (BID) | INTRAMUSCULAR | Status: DC | PRN
  Filled 2019-05-23: qty 20

## 2019-05-23 MED ORDER — LAMIVUDINE 100 MG PO TABS: 100.0000 mg | ORAL_TABLET | Freq: Two times a day (BID) | ORAL | Status: DC

## 2019-05-23 MED ORDER — LIDOCAINE HCL 1 % IJ SOLN
INTRAMUSCULAR | Status: AC
  Filled 2019-05-23: qty 20

## 2019-05-23 MED ORDER — LORAZEPAM 2 MG/ML IJ SOLN
2.0000 mg | Freq: Once | INTRAMUSCULAR | Status: AC
  Administered 2019-05-23: 2 mg via INTRAVENOUS
  Filled 2019-05-23: qty 1

## 2019-05-23 MED ORDER — QUETIAPINE FUMARATE 50 MG PO TABS
50.0000 mg | ORAL_TABLET | Freq: Every day | ORAL | Status: DC
  Filled 2019-05-23: qty 1

## 2019-05-23 MED ORDER — LACTATED RINGERS IV SOLN
INTRAVENOUS | Status: DC
  Administered 2019-05-23 (×2): via INTRAVENOUS

## 2019-05-23 MED ORDER — SODIUM CHLORIDE 0.9 % IV SOLN
100.0000 mg | Freq: Two times a day (BID) | INTRAVENOUS | Status: DC
  Filled 2019-05-23 (×2): qty 100

## 2019-05-23 MED ORDER — ZOLPIDEM TARTRATE 5 MG PO TABS
5.0000 mg | ORAL_TABLET | Freq: Every evening | ORAL | Status: DC | PRN
  Administered 2019-05-23: 5 mg via ORAL
  Filled 2019-05-23: qty 1

## 2019-05-23 MED ORDER — LORAZEPAM 2 MG/ML IJ SOLN
2.0000 mg | Freq: Once | INTRAMUSCULAR | Status: AC
  Administered 2019-05-23: 2 mg via INTRAVENOUS

## 2019-05-23 MED ORDER — DEXMEDETOMIDINE HCL IN NACL 200 MCG/50ML IV SOLN
INTRAVENOUS | Status: DC | PRN
  Administered 2019-05-23: 20 ug via INTRAVENOUS

## 2019-05-23 MED ORDER — FENTANYL CITRATE (PF) 250 MCG/5ML IJ SOLN
INTRAMUSCULAR | Status: AC
  Filled 2019-05-23: qty 5

## 2019-05-23 MED ORDER — MIDAZOLAM HCL 2 MG/2ML IJ SOLN
INTRAMUSCULAR | Status: AC
  Filled 2019-05-23: qty 2

## 2019-05-23 MED ORDER — FENTANYL CITRATE (PF) 250 MCG/5ML IJ SOLN
INTRAMUSCULAR | Status: DC | PRN
  Administered 2019-05-23 (×2): 25 ug via INTRAVENOUS
  Administered 2019-05-23: 100 ug via INTRAVENOUS

## 2019-05-23 MED ORDER — BUPIVACAINE HCL (PF) 0.5 % IJ SOLN
INTRAMUSCULAR | Status: DC | PRN
  Administered 2019-05-23: 30 mL

## 2019-05-23 MED ORDER — ALBUTEROL SULFATE (2.5 MG/3ML) 0.083% IN NEBU: 3.0000 mL | INHALATION_SOLUTION | Freq: Four times a day (QID) | RESPIRATORY_TRACT | Status: DC

## 2019-05-23 MED ORDER — DIPHENHYDRAMINE HCL 50 MG/ML IJ SOLN: 25.0000 mg | Freq: Three times a day (TID) | INTRAMUSCULAR | Status: DC | PRN

## 2019-05-23 MED ORDER — HALOPERIDOL LACTATE 5 MG/ML IJ SOLN
5.0000 mg | Freq: Four times a day (QID) | INTRAMUSCULAR | Status: DC | PRN
  Filled 2019-05-23: qty 1

## 2019-05-23 MED ORDER — PROPOFOL 10 MG/ML IV BOLUS
INTRAVENOUS | Status: DC | PRN
  Administered 2019-05-23: 200 mg via INTRAVENOUS

## 2019-05-23 MED ORDER — BENZTROPINE MESYLATE 1 MG PO TABS
1.0000 mg | ORAL_TABLET | Freq: Four times a day (QID) | ORAL | Status: DC | PRN
  Filled 2019-05-23: qty 1

## 2019-05-23 MED ORDER — FENTANYL CITRATE (PF) 100 MCG/2ML IJ SOLN: 25.0000 ug | INTRAMUSCULAR | Status: DC | PRN

## 2019-05-23 MED ORDER — LACTATED RINGERS IV BOLUS
1000.0000 mL | Freq: Once | INTRAVENOUS | Status: AC
  Administered 2019-05-23: 1000 mL via INTRAVENOUS

## 2019-05-23 MED ORDER — IBUPROFEN 600 MG PO TABS: 600.0000 mg | ORAL_TABLET | Freq: Four times a day (QID) | ORAL | 1 refills | Status: AC | PRN

## 2019-05-23 MED ORDER — ONDANSETRON HCL 4 MG/2ML IJ SOLN
INTRAMUSCULAR | Status: AC
  Filled 2019-05-23: qty 2

## 2019-05-23 SURGICAL SUPPLY — 26 items
DECANTER SPIKE VIAL GLASS SM (MISCELLANEOUS) ×3 IMPLANT
FILTER UTR ASPR ASSEMBLY (MISCELLANEOUS) ×3 IMPLANT
GLOVE BIO SURGEON STRL SZ 6.5 (GLOVE) ×1 IMPLANT
GLOVE BIO SURGEONS STRL SZ 6.5 (GLOVE) ×1
GLOVE BIOGEL PI IND STRL 7.0 (GLOVE) ×1 IMPLANT
GLOVE BIOGEL PI IND STRL 7.5 (GLOVE) ×1 IMPLANT
GLOVE BIOGEL PI INDICATOR 7.0 (GLOVE) ×4
GLOVE BIOGEL PI INDICATOR 7.5 (GLOVE) ×2
GLOVE NEODERM STER SZ 7 (GLOVE) ×3 IMPLANT
GOWN STRL REUS W/ TWL LRG LVL3 (GOWN DISPOSABLE) ×1 IMPLANT
GOWN STRL REUS W/ TWL XL LVL3 (GOWN DISPOSABLE) ×1 IMPLANT
GOWN STRL REUS W/TWL LRG LVL3 (GOWN DISPOSABLE) ×3
GOWN STRL REUS W/TWL XL LVL3 (GOWN DISPOSABLE) ×3
HOSE CONNECTING 18IN BERKELEY (TUBING) ×3 IMPLANT
KIT BERKELEY 1ST TRIMESTER 3/8 (MISCELLANEOUS) ×3 IMPLANT
NDL SPNL 18GX3.5 QUINCKE PK (NEEDLE) IMPLANT
NEEDLE SPNL 18GX3.5 QUINCKE PK (NEEDLE) ×3 IMPLANT
NS IRRIG 1000ML POUR BTL (IV SOLUTION) ×3 IMPLANT
PACK VAGINAL MINOR WOMEN LF (CUSTOM PROCEDURE TRAY) ×3 IMPLANT
PAD OB MATERNITY 4.3X12.25 (PERSONAL CARE ITEMS) ×3 IMPLANT
SET BERKELEY SUCTION TUBING (SUCTIONS) ×3 IMPLANT
TOWEL GREEN STERILE FF (TOWEL DISPOSABLE) ×6 IMPLANT
VACURETTE 10 RIGID CVD (CANNULA) IMPLANT
VACURETTE 7MM CVD STRL WRAP (CANNULA) ×2 IMPLANT
VACURETTE 8 RIGID CVD (CANNULA) IMPLANT
VACURETTE 9 RIGID CVD (CANNULA) IMPLANT

## 2019-05-23 NOTE — Anesthesia Procedure Notes (Signed)
Procedure Name: LMA Insertion Date/Time: 05/23/2019 5:29 PM Performed by: Janace Litten, CRNA Pre-anesthesia Checklist: Patient identified, Emergency Drugs available, Suction available and Patient being monitored Patient Re-evaluated:Patient Re-evaluated prior to induction Oxygen Delivery Method: Circle System Utilized Preoxygenation: Pre-oxygenation with 100% oxygen Induction Type: IV induction Ventilation: Mask ventilation without difficulty LMA: LMA inserted LMA Size: 3.0 Number of attempts: 1 Placement Confirmation: positive ETCO2 Tube secured with: Tape Dental Injury: Teeth and Oropharynx as per pre-operative assessment

## 2019-05-23 NOTE — Anesthesia Postprocedure Evaluation (Signed)
Anesthesia Post Note  Patient: Lydia Cochran  Procedure(s) Performed: DILATATION AND EVACUATION (N/A Vagina )     Patient location during evaluation: PACU Anesthesia Type: General Level of consciousness: awake Pain management: pain level controlled Vital Signs Assessment: post-procedure vital signs reviewed and stable Respiratory status: spontaneous breathing Cardiovascular status: stable Postop Assessment: no apparent nausea or vomiting Anesthetic complications: no    Last Vitals:  Vitals:   05/23/19 1624 05/23/19 1800  BP: 126/60 (!) 85/52  Pulse: 91 79  Resp: 18 12  Temp: 37 C (!) 36.1 C  SpO2: 100% 93%    Last Pain:  Vitals:   05/23/19 1800  TempSrc:   PainSc: Asleep                 Danelia Snodgrass

## 2019-05-23 NOTE — Progress Notes (Addendum)
Psych Berneta Levins NP) called again but no answer this time so VM left. Epic message also sent. Patient agitated again. Ativan 2mg  IV ordered.  Durene Romans MD Attending Center for [employer] (Faculty Practice) 05/23/2019 Time: [phone]

## 2019-05-23 NOTE — Anesthesia Preprocedure Evaluation (Signed)
Anesthesia Evaluation  Patient identified by MRN, date of birth, ID band Patient awake    Reviewed: Allergy & Precautions, NPO status , Patient's Chart, lab work & pertinent test results  Airway Mallampati: II  TM Distance: >3 FB     Dental   Pulmonary former smoker,    breath sounds clear to auscultation       Cardiovascular negative cardio ROS   Rhythm:Regular Rate:Normal     Neuro/Psych    GI/Hepatic negative GI ROS, Neg liver ROS,   Endo/Other  negative endocrine ROS  Renal/GU negative Renal ROS     Musculoskeletal   Abdominal   Peds  Hematology   Anesthesia Other Findings   Reproductive/Obstetrics                             Anesthesia Physical Anesthesia Plan  ASA: I  Anesthesia Plan: General and MAC   Post-op Pain Management:    Induction: Intravenous  PONV Risk Score and Plan: 3 and Ondansetron, Dexamethasone and Midazolam  Airway Management Planned: LMA, Simple Face Mask and Nasal Cannula  Additional Equipment:   Intra-op Plan:   Post-operative Plan:   Informed Consent: I have reviewed the patients History and Physical, chart, labs and discussed the procedure including the risks, benefits and alternatives for the proposed anesthesia with the patient or authorized representative who has indicated his/her understanding and acceptance.     Dental advisory given  Plan Discussed with: CRNA and Anesthesiologist  Anesthesia Plan Comments:         Anesthesia Quick Evaluation

## 2019-05-23 NOTE — Progress Notes (Signed)
Pt given fresh gown and taken to [facility] specialty unit

## 2019-05-23 NOTE — Progress Notes (Signed)
GYN Note Patient screaming and acting belligerant. She is stating that she wants something to drink, that she is dehydrated, wondering why she isn't getting IVF, that she's been here since 1500 yesterday and was told last night she would get her surgery this morning, she states that she is cramping and that she wants to know why no one is getting her "dead baby out of me." she is also screaming and wanting her albuterol INH and xanax. She is also screaming that since I am a man that I can understand the pain she is going through.   I told her that she can't drink anything b/c it would delay her surgery, I told her that she does have IVF infusing. I told her I was not on call last night and did not say she could have her surgery this morning and that her surgery is scheduled for as fast as we can do it. I told her that she needs to stop screaming because we are all trying to help her and that doing this does not improve the situation.   I called pscyhiatry and they said they are aware of the patient and they offered to put in med recommendations now and an IPAD consult PRN. I said that would be fine and I can have them see her if the medications do not work. Charge nurse to also have sitter for patient  Durene Romans MD Attending Center for [employer] (Faculty Practice) 05/23/2019 Time: 1010am

## 2019-05-23 NOTE — Op Note (Signed)
05/23/2019  5:51 PM  PATIENT:  Lydia Cochran  28 y.o. female  PRE-OPERATIVE DIAGNOSIS:  Missed abortion, 10 weeks  POST-OPERATIVE DIAGNOSIS:  same  PROCEDURE:  Procedure(s): DILATATION AND EVACUATION (N/A)  SURGEON:  Surgeon(s) and Role:    * Moriah Loughry, Wilhemina Cash, MD - Primary    * Clarnce Flock, MD - Assisting   ANESTHESIA:   local and general  EBL:  minimal  BLOOD ADMINISTERED:none  DRAINS: none   LOCAL MEDICATIONS USED:  MARCAINE     SPECIMEN:  Source of Specimen:  uterine curettings  DISPOSITION OF SPECIMEN:  PATHOLOGY  COUNTS:  YES  TOURNIQUET:  * No tourniquets in log *  DICTATION: .Dragon Dictation  PLAN OF CARE: Discharge to home after PACU  PATIENT DISPOSITION:  PACU - hemodynamically stable.   Delay start of Pharmacological VTE agent (>24hrs) due to surgical blood loss or risk of bleeding: not applicable  The risks, benefits, and alternatives of surgery were explained, understood, and accepted. All questions were answered. Consents were signed. In the operating room general anesthesia was applied without complication, and she was placed in the dorsal lithotomy position. Her vagina was prepped and draped in the usual sterile fashion.  A bimanual exam revealed a 10 week size , anteverted mobile uterus. Her adnexa were nonenlarged. A speculum was placed and a single-tooth tenaculum was used to grasp the anterior lip of her cervix. A total of 30 mL of 0.5% Marcaine was used to perform a paracervical block. Her uterus sounded to 10 cm. Her cervix was carefully and slowly dilated to accommodate a small curette. A #7 curved suction curette was used to empty the uterus of its contents.  There was no bleeding noted at the end of the case. She was taken to the recovery room after being extubated. She tolerated the procedure well.

## 2019-05-23 NOTE — Transfer of Care (Signed)
Immediate Anesthesia Transfer of Care Note  Patient: Lydia Cochran  Procedure(s) Performed: DILATATION AND EVACUATION (N/A Vagina )  Patient Location: PACU  Anesthesia Type:General  Level of Consciousness: lethargic and responds to stimulation  Airway & Oxygen Therapy: Patient Spontanous Breathing  Post-op Assessment: Report given to RN and Post -op Vital signs reviewed and stable  Post vital signs: Reviewed and stable  Last Vitals:  Vitals Value Taken Time  BP 85/52 05/23/19 1800  Temp 36.1 C 05/23/19 1800  Pulse 77 05/23/19 1802  Resp 10 05/23/19 1802  SpO2 97 % 05/23/19 1802  Vitals shown include unvalidated device data.  Last Pain:  Vitals:   05/23/19 1624  TempSrc: Oral  PainSc:       Patients Stated Pain Goal: 2 (51/70/01 7494)  Complications: No apparent anesthesia complications

## 2019-05-23 NOTE — H&P (Signed)
Center for [facility] Healthcare History and Physical  Lydia Cochran BVQ:[id number] DOB: [birth year] DOA: 05/22/2019  Referring physician:  PCP: Patient, No Pcp Per  Outpatient Specialists:   Patient Coming From: home  Chief Complaint: abdominal pain  HPI: Lydia Cochran is a 28 y.o. female [phone] at 101w5d by [place] this AM. She has a history of multiple SABs. She was seen in the office for care and was diagnosed with SAB. She was supposed to return to the office on Thursday for preop consult as she declined oral cytotec and the baby measuring [redacted]w[redacted]d. The patient presents here due to bleeding and cramping. US showed sac in lower uterine segment. Patient feels that she is having too much pain to go home safely.  Emergency Department Course:  Review of Systems:   Pt denies any fevers, chills, nausea, vomiting, diarrhea, constipation, shortness of breath, dyspnea on exertion, orthopnea, cough, wheezing, palpitations, headache, vision changes, lightheadedness, dizziness, melena, rectal bleeding.  Review of systems are otherwise negative  Past Medical History:  Diagnosis Date  . Anxiety   . Bipolar affective (HCC)   . Fractures    right hand  . History of kidney stones   . Intermittent explosive disorder   . Panic attack   . PTSD (post-traumatic stress disorder)    Past Surgical History:  Procedure Laterality Date  . BREAST ENHANCEMENT SURGERY    . CESAREAN SECTION    . DILATION AND CURETTAGE OF UTERUS    . OPEN REDUCTION INTERNAL FIXATION (ORIF) METACARPAL Right 03/29/2019   Procedure: Right hand fracture open reduction and surgical fixation as indicated,open treatment of phalangeal shagt fracture and open treatment of metacarpal fracture;  Surgeon: Ernest Mallick, MD;  Location: [facility] OR;  Service: Orthopedics;  Laterality: Right;  with regional block   Social History:  reports that she has quit smoking. Her smoking use included cigars. She has never used smokeless tobacco. She  reports previous drug use. Drug: Marijuana. She reports that she does not drink alcohol. Patient lives at home  Allergies  Allergen Reactions  . Delsym  [Dextromethorphan Polistirex Er] Itching  . Chocolate Flavor Nausea And Vomiting  . Darvocet [Propoxyphene N-Acetaminophen] Itching and Nausea And Vomiting  . Morphine Itching    N/V.  Can take Hydrocodone and Oxycodone  . Tape     Plastic tape gives her a reaction. Paper tape must be used.   . Tramadol Nausea And Vomiting    Family History  Problem Relation Age of Onset  . Brain cancer Father   . Lupus Mother       Prior to Admission medications   Medication Sig Start Date End Date Taking? Authorizing Provider  albuterol (VENTOLIN HFA) 108 (90 Base) MCG/ACT inhaler Inhale 2 puffs into the lungs every 6 (six) hours as needed for wheezing or shortness of breath.    [provider]  HYDROcodone-acetaminophen (NORCO) 5-325 MG tablet Take 1-2 tablets by mouth every 6 (six) hours as needed for moderate pain. 03/29/19   Cain Saupe III, MD  HYDROcodone-acetaminophen (NORCO/VICODIN) 5-325 MG tablet Take 1-2 tablets by mouth every 6 (six) hours as needed for moderate pain. 03/29/19   Cain Saupe III, MD  lamivudine (EPIVIR) 100 MG tablet Take 100 mg by mouth 2 (two) times daily.    [provider]  lamoTRIgine (LAMICTAL) 100 MG tablet Take 100 mg by mouth daily.    [provider]  QUEtiapine (SEROQUEL) 50 MG tablet Take 50 mg by mouth  at bedtime.  10/11/18   [provider]  sertraline (ZOLOFT) 100 MG tablet Take 200 mg by mouth daily.  10/23/18   [provider]    Physical Exam: BP (!) 96/50   Pulse 64   Resp 18   LMP 03/09/2019   . General: Young female. Awake and alert and oriented x3. No acute cardiopulmonary distress.  Marland Kitchen HEENT: Normocephalic atraumatic.  Right and left ears normal in appearance.  Pupils equal, round, reactive to light. Extraocular muscles are intact. Sclerae  anicteric and noninjected.  Moist mucosal membranes. No mucosal lesions.  . Neck: Neck supple without lymphadenopathy. No carotid bruits. No masses palpated.  . Cardiovascular: Regular rate with normal S1-S2 sounds. No murmurs, rubs, gallops auscultated. No JVD.  Marland Kitchen Respiratory: Good respiratory effort with no wheezes, rales, rhonchi. Lungs clear to auscultation bilaterally.  No accessory muscle use. . Abdomen: Soft, tender in the pelvis without rebound or guarding. Nondistended. Active bowel sounds. No masses or hepatosplenomegaly  . Skin: No rashes, lesions, or ulcerations.  Dry, warm to touch. 2+ dorsalis pedis and radial pulses. . Musculoskeletal: No calf or leg pain. All major joints not erythematous nontender.  No upper or lower joint deformation.  Good ROM.  No contractures  . Psychiatric: Intact judgment and insight. Pleasant and cooperative. . Neurologic: No focal neurological deficits. Strength is 5/5 and symmetric in upper and lower extremities.  Cranial nerves II through XII are grossly intact.           Labs on Admission: I have personally reviewed following labs and imaging studies  CBC: Recent Labs  Lab 05/22/19 1806  WBC 16.2*  HGB 11.2*  HCT 32.8*  MCV 89.9  PLT 782   Basic Metabolic Panel: No results for input(s): NA, K, CL, CO2, GLUCOSE, BUN, CREATININE, CALCIUM, MG, PHOS in the last 168 hours. GFR: CrCl cannot be calculated (Patient's most recent lab result is older than the maximum 21 days allowed.). Liver Function Tests: No results for input(s): AST, ALT, ALKPHOS, BILITOT, PROT, ALBUMIN in the last 168 hours. No results for input(s): LIPASE, AMYLASE in the last 168 hours. No results for input(s): AMMONIA in the last 168 hours. Coagulation Profile: No results for input(s): INR, PROTIME in the last 168 hours. Cardiac Enzymes: No results for input(s): CKTOTAL, CKMB, CKMBINDEX, TROPONINI in the last 168 hours. BNP (last 3 results) No results for input(s): PROBNP  in the last 8760 hours. HbA1C: No results for input(s): HGBA1C in the last 72 hours. CBG: No results for input(s): GLUCAP in the last 168 hours. Lipid Profile: No results for input(s): CHOL, HDL, LDLCALC, TRIG, CHOLHDL, LDLDIRECT in the last 72 hours. Thyroid Function Tests: No results for input(s): TSH, T4TOTAL, FREET4, T3FREE, THYROIDAB in the last 72 hours. Anemia Panel: No results for input(s): VITAMINB12, FOLATE, FERRITIN, TIBC, IRON, RETICCTPCT in the last 72 hours. Urine analysis:    Component Value Date/Time   COLORURINE YELLOW 03/22/2019 1850   APPEARANCEUR CLOUDY (A) 03/22/2019 1850   LABSPEC 1.018 03/22/2019 1850   PHURINE 7.0 03/22/2019 1850   GLUCOSEU NEGATIVE 03/22/2019 1850   HGBUR NEGATIVE 03/22/2019 1850   BILIRUBINUR NEGATIVE 03/22/2019 1850   KETONESUR NEGATIVE 03/22/2019 1850   PROTEINUR 30 (A) 03/22/2019 1850   UROBILINOGEN 0.2 11/16/2012 1536   NITRITE NEGATIVE 03/22/2019 1850   LEUKOCYTESUR NEGATIVE 03/22/2019 1850   Sepsis Labs: @LABRCNTIP (procalcitonin:4,lacticidven:4) )No results found for this or any previous visit (from the past 240 hour(s)).   Radiological Exams on Admission: US Ob  Comp Less 14 Wks  Result Date: 05/22/2019 CLINICAL DATA:  Pregnant patient in first-trimester pregnancy with vaginal bleeding and abdominal cramping. Nonviable pregnancy. EXAM: OBSTETRIC <14 WK ULTRASOUND TECHNIQUE: Transabdominal ultrasound was performed for evaluation of the gestation as well as the maternal uterus and adnexal regions. COMPARISON:  None available this pregnancy. FINDINGS: Intrauterine gestational sac: Single Yolk sac:  Visualized. Embryo:  Visualized. Cardiac Activity: Not Visualized. Heart Rate: 0 bpm CRL:   30.1 mm   9 w 6 d Subchorionic hemorrhage:  None visualized. Maternal uterus/adnexae: Gestational sac noted in the lower uterine segment. Small amount of fluid in the cervical canal. Neither ovary was visualized, no gross adnexal mass. IMPRESSION:  Nonviable intrauterine pregnancy at 9 weeks 6 days by crown-rump length. Absent fetal heart tones consistent with fetal demise. Electronically Signed   By: Narda RutherfordMelanie  Sanford M.D.   On: 05/22/2019 19:16     Assessment/Plan: Active Problems:   Missed abortion    This patient was discussed with the ED physician, including pertinent vitals, physical exam findings, labs, and imaging.  We also discussed care given by the ED provider.  1. MAB a. Pain control b. Obs c. NPO after midnight d. Will see if we can add on in the morning e. ABO RH 2. Psychiatric a. Hasn't been taking prescribed meds b. Will restart them.  DVT prophylaxis: SCDs Consultants: none Code Status: full Family Communication: none     Levie HeritageStinson, Braylin Xu J, DO

## 2019-05-23 NOTE — Discharge Instructions (Signed)
Dilation and Curettage or Vacuum Curettage, Care After °This sheet gives you information about how to care for yourself after your procedure. Your health care provider may also give you more specific instructions. If you have problems or questions, contact your health care provider. °What can I expect after the procedure? °After your procedure, it is common to have: °· Mild pain or cramping. °· Some vaginal bleeding or spotting. °These may last for up to 2 weeks after your procedure. °Follow these instructions at home: °Activity ° °· Do not drive or use heavy machinery while taking prescription pain medicine. °· Avoid driving for the first 24 hours after your procedure. °· Take frequent, short walks, followed by rest periods, throughout the day. Ask your health care provider what activities are safe for you. After 1-2 days, you may be able to return to your normal activities. °· Do not lift anything heavier than 10 lb (4.5 kg) until your health care provider approves. °· For at least 2 weeks, or as long as told by your health care provider, do not: °? Douche. °? Use tampons. °? Have sexual intercourse. °General instructions ° °· Take over-the-counter and prescription medicines only as told by your health care provider. This is especially important if you take blood thinning medicine. °· Do not take baths, swim, or use a hot tub until your health care provider approves. Take showers instead of baths. °· Wear compression stockings as told by your health care provider. These stockings help to prevent blood clots and reduce swelling in your legs. °· It is your responsibility to get the results of your procedure. Ask your health care provider, or the department performing the procedure, when your results will be ready. °· Keep all follow-up visits as told by your health care provider. This is important. °Contact a health care provider if: °· You have severe cramps that get worse or that do not get better with  medicine. °· You have severe abdominal pain. °· You cannot drink fluids without vomiting. °· You develop pain in a different area of your pelvis. °· You have bad-smelling vaginal discharge. °· You have a rash. °Get help right away if: °· You have vaginal bleeding that soaks more than one sanitary pad in 1 hour, for 2 hours in a row. °· You pass large blood clots from your vagina. °· You have a fever that is above 100.4°F (38.0°C). °· Your abdomen feels very tender or hard. °· You have chest pain. °· You have shortness of breath. °· You cough up blood. °· You feel dizzy or light-headed. °· You faint. °· You have pain in your neck or shoulder area. °This information is not intended to replace advice given to you by your health care provider. Make sure you discuss any questions you have with your health care provider. °Document Released: 06/25/2000 Document Revised: 06/10/2017 Document Reviewed: 01/29/2016 °Elsevier Patient Education © 2020 [employer] ° °

## 2019-05-23 NOTE — Progress Notes (Signed)
See Dr. Thomes Dinning note regarding patient's outburst.   House coverage, security and charge nurse in department.  1:1 safety sitter initiated

## 2019-05-23 NOTE — Progress Notes (Signed)
Daily Antepartum Note  Admission Date: 05/22/2019 Current Date: 05/23/2019 8:43 AM  Lydia Cochran is a 28 y.o. [id number] @ [redacted]w[redacted]d by ultrasound on 11/10 (crown rump length), HD#1, admitted for threatened abortion, IUFD.  Pregnancy complicated by: Bipolar, anxiety/depression, explosive disorder. Patient Active Problem List   Diagnosis Date Noted  . Missed abortion 05/23/2019  . Acute shoulder bursitis, right 11/10/2018  . Intermittent explosive disorder   . Anxiety   . Panic attack     Overnight/24hr events:  See H&P  Subjective:  Continues to have cramping and some VB. Confirms NPO  Objective:    Current Vital Signs 24h Vital Sign Ranges  T 98.1 F (36.7 C) Temp  Avg: 98.2 F (36.8 C)  Min: 98 F (36.7 C)  Max: 98.4 F (36.9 C)  BP 106/74 BP  Min: 83/70  Max: 106/74  HR 74 Pulse  Avg: 79.5  Min: 64  Max: 93  RR 18 Resp  Avg: 18  Min: 18  Max: 18  SaO2 100 %   SpO2  Avg: 100 %  Min: 100 %  Max: 100 %       24 Hour I/O Current Shift I/O  Time Ins Outs No intake/output data recorded. No intake/output data recorded.   Patient Vitals for the past 24 hrs:  BP Temp Temp src Pulse Resp SpO2  05/23/19 0806 106/74 98.1 F (36.7 C) Oral 74 - 100 %  05/23/19 0648 - 98 F (36.7 C) Oral - 18 100 %  05/23/19 0211 (!) 105/58 98.4 F (36.9 C) - 66 18 100 %  05/22/19 2118 (!) 96/50 - - 64 - -  05/22/19 1749 (!) 106/51 - - 93 18 -  05/22/19 1736 (!) 106/51 - - 93 - -  05/22/19 1734 (!) 83/70 - - 87 - -    Physical exam: General: Well nourished, well developed female in no acute distress. Abdomen: nttp Cardiovascular: S1, S2 normal, no murmur, rub or gallop, regular rate and rhythm Respiratory: CTAB Extremities: no clubbing, cyanosis or edema Skin: Warm and dry.   Medications: Current Facility-Administered Medications  Medication Dose Route Frequency Provider Last Rate Last Dose  . doxycycline (VIBRAMYCIN) 100 mg in sodium chloride 0.9 % 250 mL IVPB  100 mg Intravenous  Q12H Aletha Halim, MD      . famotidine (PEPCID) IVPB 20 mg premix  20 mg Intravenous Q12H Truett Mainland, DO      . HYDROmorphone (DILAUDID) injection 1 mg  1 mg Intravenous Q3H PRN Truett Mainland, DO   1 mg at 05/23/19 2725  . HYDROmorphone (DILAUDID) tablet 1 mg  1 mg Oral Once Stinson, Jacob J, DO      . ibuprofen (ADVIL) tablet 800 mg  800 mg Oral Once Sparacino, Hailey L, DO      . lactated ringers infusion   Intravenous Continuous Truett Mainland, DO 125 mL/hr at 05/23/19 3664    . lamoTRIgine (LAMICTAL) tablet 100 mg  100 mg Oral Daily Truett Mainland, DO      . ondansetron [facility]) tablet 4 mg  4 mg Oral Q6H PRN Truett Mainland, DO       Or  . ondansetron [facility]) injection 4 mg  4 mg Intravenous Q6H PRN Truett Mainland, DO      . QUEtiapine (SEROQUEL) tablet 50 mg  50 mg Oral QHS Stinson, Jacob J, DO      . zolpidem (AMBIEN) tablet 5 mg  5 mg Oral QHS  PRN Levie Heritage, DO   5 mg at 05/23/19 6010    Labs:  Recent Labs  Lab 05/22/19 1806 05/23/19 0759  WBC 16.2* 8.5  HGB 11.2* 10.6*  HCT 32.8* 31.7*  PLT 257 254   O POS  UDS: +THC, benzo, opiates Negative: covid swab  Radiology:  CLINICAL DATA:  Pregnant patient in first-trimester pregnancy with vaginal bleeding and abdominal cramping. Nonviable pregnancy.  EXAM: OBSTETRIC <14 WK ULTRASOUND  TECHNIQUE: Transabdominal ultrasound was performed for evaluation of the gestation as well as the maternal uterus and adnexal regions.  COMPARISON:  None available this pregnancy.  FINDINGS: Intrauterine gestational sac: Single  Yolk sac:  Visualized.  Embryo:  Visualized.  Cardiac Activity: Not Visualized.  Heart Rate: 0 bpm  CRL:   30.1 mm   9 w 6 d  Subchorionic hemorrhage:  None visualized.  Maternal uterus/adnexae: Gestational sac noted in the lower uterine segment. Small amount of fluid in the cervical canal. Neither ovary was visualized, no gross adnexal  mass.  IMPRESSION: Nonviable intrauterine pregnancy at 9 weeks 6 days by crown-rump length. Absent fetal heart tones consistent with fetal demise.   Electronically Signed   By: Narda Rutherford M.D.   On: 05/22/2019 19:16  Assessment & Plan:  Pt stable *IUFD, threatened abortion: pt adamant and wants to stay. Will call OR and see about time and tentatively posted for 1600-1700 today -pt is Rh positive *Psych: stable. Continue home meds. Needs close pp follow up *FEN/GI: continue NPO *Dispo: after surgery. D/w pt re: okay for d/c after surgery if all goes well which I anticipate  Cornelia Copa MD Attending Center for [facility])

## 2019-05-24 ENCOUNTER — Ambulatory Visit: Payer: Medicare Other | Admitting: Obstetrics & Gynecology

## 2019-05-24 ENCOUNTER — Encounter (HOSPITAL_COMMUNITY): Payer: Self-pay | Admitting: Obstetrics & Gynecology

## 2019-05-24 LAB — CULTURE, OB URINE

## 2019-05-25 LAB — SURGICAL PATHOLOGY

## 2019-06-04 ENCOUNTER — Encounter (HOSPITAL_COMMUNITY): Payer: Self-pay | Admitting: Obstetrics & Gynecology

## 2019-06-29 ENCOUNTER — Other Ambulatory Visit: Payer: Self-pay

## 2019-06-29 ENCOUNTER — Emergency Department (INDEPENDENT_AMBULATORY_CARE_PROVIDER_SITE_OTHER)
Admission: EM | Admit: 2019-06-29 | Discharge: 2019-06-29 | Disposition: A | Discharge status: Home / Self Care | Payer: Medicare Other | Source: Home / Self Care | Attending: Family Medicine | Admitting: Family Medicine

## 2019-06-29 DIAGNOSIS — Z3200 Encounter for pregnancy test, result unknown: Secondary | ICD-10-CM

## 2019-06-29 DIAGNOSIS — R109 Unspecified abdominal pain: Secondary | ICD-10-CM

## 2019-06-29 LAB — POCT URINALYSIS DIP (MANUAL ENTRY)
Bilirubin, UA: NEGATIVE
Blood, UA: NEGATIVE
Glucose, UA: NEGATIVE mg/dL
Ketones, POC UA: NEGATIVE mg/dL
Nitrite, UA: NEGATIVE
Protein Ur, POC: 30 mg/dL — AB
Spec Grav, UA: >=1.03 — AB (ref 1.010–1.025)
Urobilinogen, UA: 0.2 E.U./dL
pH, UA: 7 (ref 5.0–8.0)

## 2019-06-29 NOTE — ED Provider Notes (Signed)
[facility]    CSN: [id number] Arrival date & time: 06/29/19  1315      History   Chief Complaint Chief Complaint  Patient presents with  . Possible Pregnancy    HPI Lydia Cochran is a 28 y.o. female.   Patient presents for pregnancy testing.  She reports that she had a D&C one month ago.  1.5 weeks ago she had a brief period that lasted one day, followed by spotting for several days.  She has had vague mild abdominal pain, but denies dysuria, vaginal discharge, fevers, chills, and sweats, and nausea.  She tried a home pregnancy test this morning at home, and observed a faint line on the test card.  The history is provided by the patient.    Past Medical History:  Diagnosis Date  . Anxiety   . Bipolar affective ([place])   . Fractures    right hand  . History of kidney stones   . Intermittent explosive disorder   . Panic attack   . PTSD (post-traumatic stress disorder)     Patient Active Problem List   Diagnosis Date Noted  . Missed abortion 05/23/2019  . Acute shoulder bursitis, right 11/10/2018  . Intermittent explosive disorder   . Anxiety   . Panic attack     Past Surgical History:  Procedure Laterality Date  . BREAST ENHANCEMENT SURGERY    . CESAREAN SECTION    . DILATION AND CURETTAGE OF UTERUS    . DILATION AND EVACUATION N/A 05/23/2019   Procedure: DILATATION AND EVACUATION;  Surgeon: Emily Filbert, MD;  Location: [place];  Service: Gynecology;  Laterality: N/A;  . OPEN REDUCTION INTERNAL FIXATION (ORIF) METACARPAL Right 03/29/2019   Procedure: Right hand fracture open reduction and surgical fixation as indicated,open treatment of phalangeal shagt fracture and open treatment of metacarpal fracture;  Surgeon: Verner Mould, MD;  Location: [place];  Service: Orthopedics;  Laterality: Right;  with regional block    OB History    Gravida  8   Para  1   Term  1   Preterm      AB  6   Living  1     SAB  5   TAB  1   Ectopic     Multiple      Live Births  1            Home Medications    Prior to Admission medications   Medication Sig Start Date End Date Taking? Authorizing Provider  albuterol (VENTOLIN HFA) 108 (90 Base) MCG/ACT inhaler Inhale 2 puffs into the lungs every 6 (six) hours as needed for wheezing or shortness of breath.    [provider]  ALPRAZolam Duanne Moron) 0.5 MG tablet PLEASE SEE ATTACHED FOR DETAILED DIRECTIONS 01/11/19   [provider]  ibuprofen (ADVIL) 600 MG tablet Take 1 tablet (600 mg total) by mouth every 6 (six) hours as needed. 05/23/19   Emily Filbert, MD  lamoTRIgine (LAMICTAL) 100 MG tablet Take 100 mg by mouth daily.    [provider]  lamoTRIgine (LAMICTAL) 150 MG tablet Take 150 mg by mouth daily. 05/14/19   [provider]  QUEtiapine (SEROQUEL) 50 MG tablet Take 50 mg by mouth at bedtime.  10/11/18   [provider]  sertraline (ZOLOFT) 100 MG tablet Take 200 mg by mouth daily.  10/23/18   [provider]    Family History Family History  Problem Relation Age of  Onset  . Brain cancer Father   . Lupus Mother     Social History Social History   Tobacco Use  . Smoking status: Former Smoker    Types: Cigars  . Smokeless tobacco: Never Used  Substance Use Topics  . Alcohol use: No  . Drug use: Not Currently    Types: Marijuana     Allergies   Delsym  [dextromethorphan polistirex er], Chocolate flavor, Darvocet [propoxyphene n-acetaminophen], Morphine, Tape, and Tramadol   Review of Systems Review of Systems  Constitutional: Negative.   HENT: Negative.   Eyes: Negative.   Respiratory: Negative.   Cardiovascular: Negative.   Gastrointestinal: Negative for abdominal pain.  Genitourinary: Negative for dysuria, flank pain, frequency, hematuria, pelvic pain, urgency and vaginal discharge.  Musculoskeletal: Negative.   Skin: Negative.      Physical Exam Triage Vital Signs ED Triage Vitals  Enc Vitals  Group     BP 06/29/19 1405 99/63     Pulse Rate 06/29/19 1405 85     Resp 06/29/19 1405 20     Temp 06/29/19 1405 98.3 F (36.8 C)     Temp Source 06/29/19 1405 Oral     SpO2 06/29/19 1405 98 %     Weight 06/29/19 1406 125 lb (56.7 kg)     Height 06/29/19 1406 5\' 4"  (1.626 m)     Head Circumference --      Peak Flow --      Pain Score 06/29/19 1405 9     Pain Loc --      Pain Edu? --      Excl. in GC? --    No data found.  Updated Vital Signs BP 99/63 (BP Location: Right Arm)   Pulse 85   Temp 98.3 F (36.8 C) (Oral)   Resp 20   Ht 5\' 4"  (1.626 m)   Wt 56.7 kg   LMP 06/12/2019   SpO2 98%   Breastfeeding Unknown   BMI 21.46 kg/m   Visual Acuity Right Eye Distance:   Left Eye Distance:   Bilateral Distance:    Right Eye Near:   Left Eye Near:    Bilateral Near:     Physical Exam Nursing notes and Vital Signs reviewed. Appearance:  Patient appears stated age, and in no acute distress.    Eyes:  Pupils are equal, round, and reactive to light and accomodation.  Extraocular movement is intact.  Conjunctivae are not inflamed   Pharynx:  Normal; moist mucous membranes  Neck:  Supple.  No adenopathy Lungs:  Clear to auscultation.  Breath sounds are equal.  Moving air well. Heart:  Regular rate and rhythm without murmurs, rubs, or gallops.  Abdomen:  Nontender without masses or hepatosplenomegaly.  Bowel sounds are present.  No CVA or flank tenderness.  Extremities:  No edema.  Skin:  No rash present.     UC Treatments / Results  Labs (all labs ordered are listed, but only abnormal results are displayed) Labs Reviewed  HCG, SERUM, QUALITATIVE    EKG   Radiology No results found.  Procedures Procedures (including critical care time)  Medications Ordered in UC Medications - No data to display  Initial Impression / Assessment and Plan / UC Course  I have reviewed the triage vital signs and the nursing notes.  Pertinent labs & imaging results that were  available during my care of the patient were reviewed by me and considered in my medical decision making (see chart for details).  Will send qualitative Hcg. Followup with GYN if symptoms persist.   Final Clinical Impressions(s) / UC Diagnoses   Final diagnoses:  Encounter for pregnancy test, result unknown     Discharge Instructions     If symptoms become significantly worse during the night or over the weekend, proceed to the local emergency room.     ED Prescriptions    None        Lattie HawBeese, Aashritha Miedema A, MD 07/03/19 [phone]

## 2019-06-29 NOTE — ED Triage Notes (Signed)
Pt had a D&C in November, took pregnancy test this am at home, and saw a faint line.  Here for a blood test.  Pain in the sides, a week and a half ago had a period for 1 day, and then several days of spotting.

## 2019-06-29 NOTE — Discharge Instructions (Addendum)
If symptoms become significantly worse during the night or over the weekend, proceed to the local emergency room.  

## 2019-06-30 LAB — URINE CULTURE
MICRO NUMBER:: 1213173
SPECIMEN QUALITY:: ADEQUATE

## 2019-07-03 ENCOUNTER — Ambulatory Visit: Payer: Medicare Other | Admitting: Certified Nurse Midwife

## 2019-07-04 ENCOUNTER — Encounter: Payer: Self-pay | Admitting: Obstetrics and Gynecology

## 2019-07-04 ENCOUNTER — Other Ambulatory Visit (HOSPITAL_COMMUNITY)
Admission: RE | Admit: 2019-07-04 | Discharge: 2019-07-04 | Disposition: A | Discharge status: Home / Self Care | Payer: Medicare Other | Source: Ambulatory Visit | Attending: Obstetrics and Gynecology | Admitting: Obstetrics and Gynecology

## 2019-07-04 ENCOUNTER — Ambulatory Visit (INDEPENDENT_AMBULATORY_CARE_PROVIDER_SITE_OTHER): Payer: Medicare Other | Admitting: Obstetrics and Gynecology

## 2019-07-04 ENCOUNTER — Other Ambulatory Visit: Payer: Self-pay

## 2019-07-04 VITALS — BP 96/67 | HR 77 | Temp 98.2°F | Resp 16 | Ht 66.0 in | Wt 116.0 lb

## 2019-07-04 DIAGNOSIS — Z3202 Encounter for pregnancy test, result negative: Secondary | ICD-10-CM | POA: Diagnosis not present

## 2019-07-04 DIAGNOSIS — Z8759 Personal history of other complications of pregnancy, childbirth and the puerperium: Secondary | ICD-10-CM | POA: Insufficient documentation

## 2019-07-04 DIAGNOSIS — N898 Other specified noninflammatory disorders of vagina: Secondary | ICD-10-CM

## 2019-07-04 DIAGNOSIS — Z3200 Encounter for pregnancy test, result unknown: Secondary | ICD-10-CM

## 2019-07-04 LAB — POCT URINE PREGNANCY: Preg Test, Ur: NEGATIVE

## 2019-07-04 NOTE — Progress Notes (Signed)
GYNECOLOGY ENCOUNTER NOTE  History:     Lydia Cochran is a 28 y.o. [phone] female here for a pregnancy test. States she had a positive Friday and then 2 negative tests. She has a strong history of miscarriage. She is also complaining of an odorous discharge and thinks she may have BV. She is currently taking BC pills and is not interested in anything else for [facility] at this time. She is interested in becoming pregnant in the near future.   Obstetric History OB History  Gravida Para Term Preterm AB Living  8 1 1   6 1   SAB TAB Ectopic Multiple Live Births  5 1     1     # Outcome Date GA Lbr Len/2nd Weight Sex Delivery Anes PTL Lv  8 Gravida           7 SAB 10/31/18          6 SAB 07/31/18          5 TAB           4 SAB           3 SAB           2 SAB           1 Term      Vag-Spont   LIV    Past Medical History:  Diagnosis Date  . Anxiety   . Bipolar affective (HCC)   . Fractures    right hand  . History of kidney stones   . Intermittent explosive disorder   . Panic attack   . PTSD (post-traumatic stress disorder)     Past Surgical History:  Procedure Laterality Date  . BREAST ENHANCEMENT SURGERY    . CESAREAN SECTION    . DILATION AND CURETTAGE OF UTERUS    . DILATION AND EVACUATION N/A 05/23/2019   Procedure: DILATATION AND EVACUATION;  Surgeon: 08/02/18, MD;  Location: [facility] OR;  Service: Gynecology;  Laterality: N/A;  . OPEN REDUCTION INTERNAL FIXATION (ORIF) METACARPAL Right 03/29/2019   Procedure: Right hand fracture open reduction and surgical fixation as indicated,open treatment of phalangeal shagt fracture and open treatment of metacarpal fracture;  Surgeon: Allie Bossier, MD;  Location: [facility] OR;  Service: Orthopedics;  Laterality: Right;  with regional block    Current Outpatient Medications on File Prior to Visit  Medication Sig Dispense Refill  . albuterol (VENTOLIN HFA) 108 (90 Base) MCG/ACT inhaler Inhale 2 puffs into the lungs every 6 (six)  hours as needed for wheezing or shortness of breath.    . ALPRAZolam (XANAX) 0.5 MG tablet PLEASE SEE ATTACHED FOR DETAILED DIRECTIONS    . lamoTRIgine (LAMICTAL) 100 MG tablet Take 100 mg by mouth daily.    03/31/2019 lamoTRIgine (LAMICTAL) 150 MG tablet Take 150 mg by mouth daily.    . QUEtiapine (SEROQUEL) 50 MG tablet Take 50 mg by mouth at bedtime.     Ernest Mallick ibuprofen (ADVIL) 600 MG tablet Take 1 tablet (600 mg total) by mouth every 6 (six) hours as needed. 60 tablet 1  . sertraline (ZOLOFT) 100 MG tablet Take 200 mg by mouth daily.      No current facility-administered medications on file prior to visit.    Allergies  Allergen Reactions  . Delsym  [Dextromethorphan Polistirex Er] Itching  . Chocolate Flavor Nausea And Vomiting  . Darvocet [Propoxyphene N-Acetaminophen] Itching and Nausea And Vomiting  . Morphine Itching    N/V.  Can take Hydrocodone and Oxycodone  . Tape     Plastic tape gives her a reaction. Paper tape must be used.   . Tramadol Nausea And Vomiting    Social History:  reports that she has quit smoking. Her smoking use included cigars. She has never used smokeless tobacco. She reports previous drug use. Drug: Marijuana. She reports that she does not drink alcohol.  Family History  Problem Relation Age of Onset  . Brain cancer Father   . Lupus Mother     The following portions of the patient's history were reviewed and updated as appropriate: allergies, current medications, past family history, past medical history, past social history, past surgical history and problem list.  Review of Systems Pertinent items noted in HPI and remainder of comprehensive ROS otherwise negative.  Physical Exam:  BP 96/67   Pulse 77   Temp 98.2 F (36.8 C) (Oral)   Resp 16   Ht 5\' 6"  (1.676 m)   Wt 116 lb (52.6 kg)   LMP 06/12/2019   Breastfeeding No   BMI 18.72 kg/m  CONSTITUTIONAL: Well-developed, well-nourished female in no acute distress.  HENT:  Normocephalic, atraumatic,  External right and left ear normal.  EYES: Conjunctivae and EOM are normal. Pupils are equal, round. NEUROLOGIC: Alert and oriented to person, place, and time.  PSYCHIATRIC: Normal mood and affect. Normal behavior. Normal judgment and thought content. PELVIC: Normal appearing external genitalia and urethral meatus. Swabs collected without speculum.  Assessment and Plan:  1. Unconfirmed pregnancy  - POCT urine pregnancy- Negative - One positive home pregnancy test: Hcg collected today - Discussed waiting for pregnancy. Recent SAB with D&C, body needs time to recover.  - Continue BC pills - Needs to schedule annual exam.     Gaius Ishaq, Artist Pais, [place] for [employer], [place]

## 2019-07-05 LAB — CERVICOVAGINAL ANCILLARY ONLY
Bacterial Vaginitis (gardnerella): POSITIVE — AB
Candida Glabrata: NEGATIVE
Candida Vaginitis: NEGATIVE
Chlamydia: NEGATIVE
Comment: NEGATIVE
Comment: NEGATIVE
Comment: NEGATIVE
Comment: NEGATIVE
Comment: NEGATIVE
Comment: NORMAL
Neisseria Gonorrhea: NEGATIVE
Trichomonas: NEGATIVE

## 2019-07-05 LAB — HCG, QUANTITATIVE, PREGNANCY: HCG, Total, QN: <3 m[IU]/mL

## 2019-07-07 DIAGNOSIS — B9689 Other specified bacterial agents as the cause of diseases classified elsewhere: Secondary | ICD-10-CM

## 2019-07-07 DIAGNOSIS — N760 Acute vaginitis: Secondary | ICD-10-CM

## 2019-07-09 ENCOUNTER — Telehealth: Payer: Self-pay

## 2019-07-09 DIAGNOSIS — B9689 Other specified bacterial agents as the cause of diseases classified elsewhere: Secondary | ICD-10-CM

## 2019-07-09 DIAGNOSIS — N760 Acute vaginitis: Secondary | ICD-10-CM

## 2019-07-09 MED ORDER — METRONIDAZOLE 500 MG PO TABS: 500.0000 mg | ORAL_TABLET | Freq: Two times a day (BID) | ORAL | 0 refills | Status: DC

## 2019-07-09 NOTE — Telephone Encounter (Signed)
Pt sent a MyChart message and called the office stating she needs medication for BV after seeing results on MyChart. Pt is positive for BV. Pharmacy verified and Flagyl sent.

## 2019-07-10 ENCOUNTER — Encounter: Payer: Self-pay | Admitting: Advanced Practice Midwife

## 2019-07-10 ENCOUNTER — Ambulatory Visit (INDEPENDENT_AMBULATORY_CARE_PROVIDER_SITE_OTHER): Payer: Medicare Other | Admitting: Advanced Practice Midwife

## 2019-07-10 ENCOUNTER — Other Ambulatory Visit (HOSPITAL_COMMUNITY)
Admission: RE | Admit: 2019-07-10 | Discharge: 2019-07-10 | Disposition: A | Discharge status: Home / Self Care | Payer: Medicare Other | Source: Ambulatory Visit | Attending: Advanced Practice Midwife | Admitting: Advanced Practice Midwife

## 2019-07-10 ENCOUNTER — Other Ambulatory Visit: Payer: Self-pay

## 2019-07-10 VITALS — BP 104/60 | HR 86 | Wt 118.0 lb

## 2019-07-10 DIAGNOSIS — Z01419 Encounter for gynecological examination (general) (routine) without abnormal findings: Secondary | ICD-10-CM | POA: Insufficient documentation

## 2019-07-10 DIAGNOSIS — F4329 Adjustment disorder with other symptoms: Secondary | ICD-10-CM

## 2019-07-10 DIAGNOSIS — N920 Excessive and frequent menstruation with regular cycle: Secondary | ICD-10-CM | POA: Diagnosis not present

## 2019-07-10 DIAGNOSIS — Z8759 Personal history of other complications of pregnancy, childbirth and the puerperium: Secondary | ICD-10-CM

## 2019-07-10 DIAGNOSIS — Z3009 Encounter for other general counseling and advice on contraception: Secondary | ICD-10-CM | POA: Diagnosis not present

## 2019-07-10 MED ORDER — NORGESTIMATE-ETH ESTRADIOL 0.25-35 MG-MCG PO TABS: 1.0000 | ORAL_TABLET | Freq: Every day | ORAL | 11 refills | Status: DC

## 2019-07-10 NOTE — Progress Notes (Signed)
Subjective:     Lydia Cochran is a 28 y.o. female here for a routine exam.  Current complaints: None.  Personal health questionnaire reviewed: yes.  Do you have a primary care provider? yes How many times per week do you exercise? varies Do you feel safe at home? yes Has anyone hit, slapped, or kicked you recently? no Any thoughts of harming yourself or others?  No but this year has been very stressful.    Gynecologic History Patient's last menstrual period was 06/12/2019. Contraception: none Last Pap: unsure. Results were: normal Last mammogram: n/a.   Obstetric History OB History  Gravida Para Term Preterm AB Living  10 1 1   9 1   SAB TAB Ectopic Multiple Live Births  8 1     1     # Outcome Date GA Lbr Len/2nd Weight Sex Delivery Anes PTL Lv  10 SAB 05/23/19 [redacted]w[redacted]d         9 SAB 10/31/18 [redacted]w[redacted]d         8 SAB 07/31/18 [redacted]w[redacted]d         7 SAB 03/15/18 [redacted]w[redacted]d         6 SAB 07/02/14 [redacted]w[redacted]d         5 SAB  [redacted]w[redacted]d         4 TAB           3 SAB           2 SAB           1 Term      Vag-Spont   LIV    Obstetric Comments  D&E      The following portions of the patient's history were reviewed and updated as appropriate: allergies, current medications, past family history, past medical history, past social history, past surgical history and problem list.  Review of Systems Pertinent items noted in HPI and remainder of comprehensive ROS otherwise negative.    Objective:     BP 104/60   Pulse 86   Wt 118 lb (53.5 kg)   LMP 06/12/2019   BMI 19.05 kg/m   VS reviewed, nursing note reviewed,  Constitutional: well developed, well nourished, no distress HEENT: normocephalic CV: normal rate Pulm/chest wall: normal effort Breast Exam:  right breast normal, implant palpable, without mass, skin or nipple changes or axillary nodes, left breast normal, implant palpable, without mass, skin or nipple changes or axillary nodes Abdomen: soft Neuro: alert and oriented x 3 Skin: warm, dry Psych:  affect normal Pelvic exam: Cervix pink, visually closed, without lesion, scant white creamy discharge, vaginal walls and external genitalia normal Bimanual exam: Cervix 0/long/high, firm, anterior, neg CMT, uterus nontender, nonenlarged, adnexa without tenderness, enlargement, or mass     Assessment/Plan:   1. General counseling and advice for contraceptive management --Pt desires to resume OCPs.  Briefly discussed other contraceptive options but pt prefers OCPs for period management of painful heavy periods and for contraception.  2. Menorrhagia with regular cycle --She has always had painful heavy periods. Her mother had endometriosis so she thinks she may have it.  Would like to resume OCPs to prevent pregnancy but also to improve menses.  --Pt low risk with OCPs but discussed s/sx of PE/DVT/reasons to seek care - norgestimate-ethinyl estradiol (ORTHO-CYCLEN) 0.25-35 MG-MCG tablet; Take 1 tablet by mouth daily.  Dispense: 1 Package; Refill: 11   3. Well woman exam with routine gynecological exam  - Cytology - PAP( [place])  4. Stress and adjustment reaction --Pt  has had 8 miscarriages, 5 in the last 2 years. She reports stress at home and sometimes lack of support.  She wants to take the OCPs to give her body a rest and work on self care this year.  She states "I want to have a baby with someone who loves me, and have a real family".  --She has a counselor she sees regularly, encouraged her to continue.  5. History of miscarriage --All first trimester miscarriages, ranging from 6 to 13 weeks.  Last miscarriage 05/2019 with D&E at [redacted]w[redacted]d.   --Pt with regular heavy menses, no difficulty becoming pregnant.  Has never tried progesterone so will consider this with next pregnancy. Desires to wait at least a few months and use OCPs before planning another pregnancy. --[facility]work at [facility] 07/2018 with normal anticardiolipin, lupus values, and TSH. --Consider imaging, pt to f/u with MD in 3  months.   Follow up in: 3 months or as needed.   Sharen Counter, CNM 3:18 PM

## 2019-07-11 LAB — CYTOLOGY - PAP: Diagnosis: NEGATIVE

## 2019-08-02 ENCOUNTER — Telehealth: Payer: Self-pay | Admitting: Sports Medicine

## 2019-08-02 NOTE — Telephone Encounter (Signed)
PT requested a MYCHART appt with Dr. Benjamin Stain for Vaginal Bacteria. I called the PT to put her with another provider to be seen for said issue. PT became Irate and started yelling why she even needed the appt. She hung up the phone. The appointment was not finalized so I didn't make the appt.

## 2019-08-10 ENCOUNTER — Other Ambulatory Visit: Payer: Self-pay

## 2019-08-10 ENCOUNTER — Other Ambulatory Visit (HOSPITAL_COMMUNITY)
Admission: RE | Admit: 2019-08-10 | Discharge: 2019-08-10 | Disposition: A | Discharge status: Home / Self Care | Payer: Medicare Other | Source: Ambulatory Visit | Attending: Certified Nurse Midwife | Admitting: Certified Nurse Midwife

## 2019-08-10 ENCOUNTER — Ambulatory Visit (INDEPENDENT_AMBULATORY_CARE_PROVIDER_SITE_OTHER): Payer: Medicare Other | Admitting: Certified Nurse Midwife

## 2019-08-10 ENCOUNTER — Encounter: Payer: Self-pay | Admitting: Certified Nurse Midwife

## 2019-08-10 VITALS — BP 94/63 | HR 74 | Temp 98.3°F | Ht 64.0 in | Wt 119.0 lb

## 2019-08-10 DIAGNOSIS — N898 Other specified noninflammatory disorders of vagina: Secondary | ICD-10-CM

## 2019-08-10 DIAGNOSIS — B9689 Other specified bacterial agents as the cause of diseases classified elsewhere: Secondary | ICD-10-CM | POA: Diagnosis not present

## 2019-08-10 DIAGNOSIS — N760 Acute vaginitis: Secondary | ICD-10-CM | POA: Diagnosis not present

## 2019-08-11 NOTE — Progress Notes (Signed)
History:  Lydia Cochran is a 29 y.o. [phone] who presents to clinic today for vaginal pain and itching. She reports this month being in [place] and forgetting her birth control pills, started her cycle 2 weeks ago and reports cycle has continued until today. She reports on Wednesday started having irritation and dark brown discharge - "wanted to make sure everything is okay". She denies concerns that she may be pregnant. She reports unprotected intercourse since last STD screening.   The following portions of the patient's history were reviewed and updated as appropriate: allergies, current medications, family history, past medical history, social history, past surgical history and problem list.  Review of Systems:  Review of Systems  Constitutional: Negative.   Respiratory: Negative.   Cardiovascular: Negative.   Gastrointestinal: Negative.   Genitourinary:       Vaginal irritation and discharge   Neurological: Negative.      Objective:  Physical Exam BP 94/63   Pulse 74   Temp 98.3 F (36.8 C)   Ht 5\' 4"  (1.626 m)   Wt 119 lb (54 kg)   BMI 20.43 kg/m  Physical Exam HENT:     Head: Normocephalic.  Cardiovascular:     Rate and Rhythm: Normal rate and regular rhythm.  Pulmonary:     Effort: Pulmonary effort is normal. No respiratory distress.     Breath sounds: Normal breath sounds. No wheezing.  Abdominal:     General: There is no distension.     Palpations: Abdomen is soft.     Tenderness: There is no abdominal tenderness.  Genitourinary:    Vagina: Bleeding present. No vaginal discharge.     Cervix: No cervical motion tenderness or friability.     Comments: Pelvic exam: Cervix pink, visually closed, without lesion, scant amount of dark red vaginal bleeding present, vaginal walls and external genitalia normal Bimanual exam: Cervix 0/long/high, firm, anterior, neg CMT, uterus nontender, nonenlarged, adnexa without tenderness, enlargement, or mass Skin:    General:  Skin is warm and dry.  Neurological:     Mental Status: She is alert and oriented to person, place, and time.  Psychiatric:        Mood and Affect: Mood normal.        Behavior: Behavior normal.        Thought Content: Thought content normal.     Assessment & Plan:  1. Vaginal irritation and discharge - Normal end of cycle bleeding, irritation and discharge most likely related to BV  - Results pending and will manage accordingly  - Discussed with patient importance of not skipping pills and taking every day/same time, patient verbalizes understanding  - Educated patient on possibility of irregular cycles for 2-3 after initiation of birth control pills  - Cervicovaginal ancillary only( [place])   , CNM

## 2019-08-15 LAB — CERVICOVAGINAL ANCILLARY ONLY
Bacterial Vaginitis (gardnerella): POSITIVE — AB
Candida Glabrata: NEGATIVE
Candida Vaginitis: NEGATIVE
Chlamydia: NEGATIVE
Comment: NEGATIVE
Comment: NEGATIVE
Comment: NEGATIVE
Comment: NEGATIVE
Comment: NEGATIVE
Comment: NORMAL
Neisseria Gonorrhea: NEGATIVE
Trichomonas: NEGATIVE

## 2019-08-15 MED ORDER — METRONIDAZOLE 500 MG PO TABS: 500.0000 mg | ORAL_TABLET | Freq: Two times a day (BID) | ORAL | 0 refills | Status: DC

## 2019-08-15 NOTE — Addendum Note (Signed)
Addended by: Sharyon Cable on: 08/15/2019 11:31 AM   Modules accepted: Orders

## 2019-08-28 ENCOUNTER — Encounter: Payer: Self-pay | Admitting: Medical

## 2019-08-28 ENCOUNTER — Other Ambulatory Visit: Payer: Self-pay

## 2019-08-28 ENCOUNTER — Ambulatory Visit (INDEPENDENT_AMBULATORY_CARE_PROVIDER_SITE_OTHER): Payer: Medicare Other | Admitting: Medical

## 2019-08-28 ENCOUNTER — Other Ambulatory Visit (HOSPITAL_COMMUNITY)
Admission: RE | Admit: 2019-08-28 | Discharge: 2019-08-28 | Disposition: A | Discharge status: Home / Self Care | Payer: Medicare Other | Source: Ambulatory Visit | Attending: Medical | Admitting: Medical

## 2019-08-28 VITALS — BP 98/67 | HR 75 | Temp 98.3°F | Resp 16 | Ht 64.0 in | Wt 119.0 lb

## 2019-08-28 DIAGNOSIS — N898 Other specified noninflammatory disorders of vagina: Secondary | ICD-10-CM

## 2019-08-28 DIAGNOSIS — N760 Acute vaginitis: Secondary | ICD-10-CM

## 2019-08-28 DIAGNOSIS — B9689 Other specified bacterial agents as the cause of diseases classified elsewhere: Secondary | ICD-10-CM

## 2019-08-28 MED ORDER — TERCONAZOLE 0.4 % VA CREA: TOPICAL_CREAM | VAGINAL | 0 refills | Status: DC

## 2019-08-28 NOTE — Patient Instructions (Signed)

## 2019-08-28 NOTE — Progress Notes (Signed)
   History:  [name] is a 29 y.o. [phone] who presents to clinic today with complaint of vaginal itching and irritation. The patient was seen in the office on  08/10/19 and diagnosed with BV. She states that she took all of the Flagyl, but did not take until ~ 2 weeks after it was prescribed. Itching and burning started over the weekend. She denies discharge or bleeding. She has had some intermittent mild pelvic pain. She has not taken anything for pain.   The following portions of the patient's history were reviewed and updated as appropriate: allergies, current medications, family history, past medical history, social history, past surgical history and problem list.  Review of Systems:  Review of Systems  Constitutional: Negative for fever.  Gastrointestinal: Positive for abdominal pain.  Genitourinary: Positive for dysuria. Negative for frequency and urgency.       Neg - vaginal bleeding, discharge + itching, irritation      Objective:  Physical Exam BP 98/67   Pulse 75   Temp 98.3 F (36.8 C)   Resp 16   Ht 5\' 4"  (1.626 m)   Wt 119 lb (54 kg)   LMP 07/28/2019   BMI 20.43 kg/m  Physical Exam  Nursing note and vitals reviewed. Constitutional: She is oriented to person, place, and time. She appears well-developed and well-nourished. No distress.  HENT:  Head: Normocephalic and atraumatic.  Cardiovascular: Normal rate.  Respiratory: Effort normal.  GI: Soft. She exhibits no distension. There is no abdominal tenderness.  Genitourinary: There is rash on the right labia. There is rash (mild diffuse erythema noted on both labia) on the left labia. Uterus is not enlarged and not tender. Cervix exhibits no motion tenderness, no discharge and no friability. Right adnexum displays no mass and no tenderness. Left adnexum displays no mass and no tenderness.    Vaginal discharge (scant, thin, white) present.     No vaginal bleeding.  No bleeding in the vagina.  Neurological:  She is alert and oriented to person, place, and time.  Skin: Skin is warm and dry. No erythema.  Psychiatric: She has a normal mood and affect.    Assessment & Plan:  1. Vaginal itching - Cervicovaginal ancillary only( [place]) - Concern for cutaneous yeast infection given recent antibiotic use and exam today  - terconazole (TERAZOL 7) 0.4 % vaginal cream; Apply cream to affected area BID for 3-7 days  Dispense: 45 g; Refill: 0 - Results will be sent to patient via MyChart  - Follow-up with [facility] as needed   07/30/2019, PA-C 08/28/2019 11:27 AM

## 2019-08-29 LAB — CERVICOVAGINAL ANCILLARY ONLY
Bacterial Vaginitis (gardnerella): POSITIVE — AB
Candida Glabrata: NEGATIVE
Candida Vaginitis: NEGATIVE
Comment: NEGATIVE
Comment: NEGATIVE
Comment: NEGATIVE

## 2019-08-29 MED ORDER — METRONIDAZOLE 0.75 % VA GEL: 1.0000 | Freq: Every day | VAGINAL | 0 refills | Status: AC

## 2019-08-29 NOTE — Addendum Note (Signed)
Addended by: Marny Lowenstein on: 08/29/2019 02:07 PM   Modules accepted: Orders

## 2019-09-10 ENCOUNTER — Other Ambulatory Visit (HOSPITAL_COMMUNITY)
Admission: RE | Admit: 2019-09-10 | Discharge: 2019-09-10 | Disposition: A | Discharge status: Home / Self Care | Payer: Medicare Other | Source: Ambulatory Visit | Attending: Obstetrics & Gynecology | Admitting: Obstetrics & Gynecology

## 2019-09-10 ENCOUNTER — Ambulatory Visit (INDEPENDENT_AMBULATORY_CARE_PROVIDER_SITE_OTHER): Payer: Medicare Other | Admitting: Obstetrics & Gynecology

## 2019-09-10 ENCOUNTER — Other Ambulatory Visit: Payer: Self-pay

## 2019-09-10 ENCOUNTER — Encounter: Payer: Self-pay | Admitting: Obstetrics & Gynecology

## 2019-09-10 VITALS — BP 107/77 | HR 91 | Ht 64.0 in | Wt 116.0 lb

## 2019-09-10 DIAGNOSIS — N898 Other specified noninflammatory disorders of vagina: Secondary | ICD-10-CM | POA: Diagnosis present

## 2019-09-10 NOTE — Progress Notes (Signed)
   Subjective:    Patient ID: Lydia Cochran, female    DOB: 1990-11-18, 29 y.o.   MRN: [id number]  HPI Pt presents for continued vaginal discharge and spotting on OCPs.  Pt has had BV several times.  Will need TOC at end of treatment if she has BV.  STD check today.  Pt declines serum testing.  Pt would like to stop OCPs.  She is not taking them daily and is having spotting.     Review of Systems  Genitourinary: Positive for menstrual problem, vaginal bleeding and vaginal discharge.       Objective:   Physical Exam Constitutional:      Appearance: Normal appearance.  Cardiovascular:     Rate and Rhythm: Normal rate.  Pulmonary:     Effort: Pulmonary effort is normal.  Genitourinary:    Comments: Tanner V Vulva:  No lesion Vagina:  Pink, no lesions, no discharge, scant blood Cervix:  No lesion, scant blood at os,    Neurological:     Mental Status: She is alert.    Vitals:   09/10/19 1356  BP: 107/77  Pulse: 91  Weight: 116 lb (52.6 kg)  Height: 5\' 4"  (1.626 m)   Assessment & Plan:  29 yo female presents for continued vaginal dischage and bleeding  1.  Pt requests to be tested for vaginal STDs as well as vaginitis.  Patient declines blood tests.  She is a 29 and does not want needle marks on her arms.  We will test for gonorrhea, chlamydia, trichomonas, bacterial vaginosis, and yeast.  If the test comes back with bacterial vaginosis, we will do a test of cure after the next treatment.  She has had several documented cases here.  2.  Patient complaining of vaginal spotting.  Patient on oral contraceptives however does not take them every day.  Patient's partner has had a vasectomy.  She would like to stop the oral contraceptives for pregnancy prevention because her partner has had a vasectomy.  UPT today is negative.

## 2019-09-11 LAB — CERVICOVAGINAL ANCILLARY ONLY
Bacterial Vaginitis (gardnerella): POSITIVE — AB
Candida Glabrata: NEGATIVE
Candida Vaginitis: NEGATIVE
Chlamydia: NEGATIVE
Comment: NEGATIVE
Comment: NEGATIVE
Comment: NEGATIVE
Comment: NEGATIVE
Comment: NEGATIVE
Comment: NORMAL
Neisseria Gonorrhea: NEGATIVE
Trichomonas: NEGATIVE

## 2019-09-12 ENCOUNTER — Encounter: Payer: Self-pay | Admitting: *Deleted

## 2019-09-12 ENCOUNTER — Telehealth: Payer: Self-pay | Admitting: *Deleted

## 2019-09-12 MED ORDER — METRONIDAZOLE 500 MG PO TABS: 500.0000 mg | ORAL_TABLET | Freq: Two times a day (BID) | ORAL | 0 refills | Status: AC

## 2019-09-12 NOTE — Telephone Encounter (Signed)
Flagyl prescribed.  Patient needs TOC in 8-9 days with RN visit.

## 2019-09-12 NOTE — Telephone Encounter (Signed)
Pt sent a mychart message stating that she is banned from [facility] and to [facility] [address].

## 2019-11-01 ENCOUNTER — Ambulatory Visit (INDEPENDENT_AMBULATORY_CARE_PROVIDER_SITE_OTHER): Payer: Medicare Other | Admitting: Obstetrics and Gynecology

## 2019-11-01 ENCOUNTER — Other Ambulatory Visit: Payer: Self-pay

## 2019-11-01 ENCOUNTER — Other Ambulatory Visit (HOSPITAL_COMMUNITY)
Admission: RE | Admit: 2019-11-01 | Discharge: 2019-11-01 | Disposition: A | Discharge status: Home / Self Care | Payer: Medicare Other | Source: Ambulatory Visit | Attending: Obstetrics and Gynecology | Admitting: Obstetrics and Gynecology

## 2019-11-01 ENCOUNTER — Encounter: Payer: Self-pay | Admitting: Obstetrics and Gynecology

## 2019-11-01 VITALS — BP 109/72 | HR 88 | Resp 16 | Ht 64.0 in | Wt 125.0 lb

## 2019-11-01 DIAGNOSIS — Z113 Encounter for screening for infections with a predominantly sexual mode of transmission: Secondary | ICD-10-CM | POA: Diagnosis present

## 2019-11-01 NOTE — Progress Notes (Signed)
29 yo P1 here for STI testing. Patient reports feeling well and is without complaints. She declined blood work but desires to be screened for any vaginal infections. Patient reports a monthly period lasting 5 days. She is sexually active using vasectomy for contraception. Patient denies pelvic pain or abnormal discharge. Patient recently seen in ED for kidney stone and reports improvement in her symptoms.   Past Medical History:  Diagnosis Date  . Anxiety   . Bipolar affective (HCC)   . Fractures    right hand  . History of kidney stones   . Intermittent explosive disorder   . Panic attack   . PTSD (post-traumatic stress disorder)    Past Surgical History:  Procedure Laterality Date  . BREAST ENHANCEMENT SURGERY    . CESAREAN SECTION    . DILATION AND CURETTAGE OF UTERUS    . DILATION AND EVACUATION N/A 05/23/2019   Procedure: DILATATION AND EVACUATION;  Surgeon: Allie Bossier, MD;  Location: [facility] OR;  Service: Gynecology;  Laterality: N/A;  . OPEN REDUCTION INTERNAL FIXATION (ORIF) METACARPAL Right 03/29/2019   Procedure: Right hand fracture open reduction and surgical fixation as indicated,open treatment of phalangeal shagt fracture and open treatment of metacarpal fracture;  Surgeon: Ernest Mallick, MD;  Location: [facility] OR;  Service: Orthopedics;  Laterality: Right;  with regional block   Family History  Problem Relation Age of Onset  . Brain cancer Father   . Lupus Mother    Social History   Tobacco Use  . Smoking status: Former Smoker    Types: Cigars  . Smokeless tobacco: Never Used  Substance Use Topics  . Alcohol use: No  . Drug use: Not Currently    Types: Marijuana   ROS See pertinent in HPI. All other systems reviewed and negative Blood pressure 109/72, pulse 88, resp. rate 16, height 5\' 4"  (1.626 m), weight 125 lb (56.7 kg), last menstrual period 10/31/2019. GENERAL: Well-developed, well-nourished female in no acute distress.  ABDOMEN: Soft, nontender,  nondistended. No organomegaly. PELVIC: Normal external female genitalia. Vagina is pink and rugated.  Normal discharge. Normal appearing cervix. Uterus is normal in size. No adnexal mass or tenderness. EXTREMITIES: No cyanosis, clubbing, or edema, 2+ distal pulses.  A/P 29 yo here for STI testing - vaginal swab collected Patient will be contacted with abnormal results Normal pap smear 06/2019

## 2019-11-01 NOTE — Progress Notes (Signed)
Pt request STD testing. 

## 2019-11-02 LAB — CERVICOVAGINAL ANCILLARY ONLY
Bacterial Vaginitis (gardnerella): POSITIVE — AB
Candida Glabrata: NEGATIVE
Candida Vaginitis: NEGATIVE
Chlamydia: NEGATIVE
Comment: NEGATIVE
Comment: NEGATIVE
Comment: NEGATIVE
Comment: NEGATIVE
Comment: NEGATIVE
Comment: NORMAL
Neisseria Gonorrhea: NEGATIVE
Trichomonas: NEGATIVE

## 2019-11-02 MED ORDER — METRONIDAZOLE 500 MG PO TABS: 500.0000 mg | ORAL_TABLET | Freq: Two times a day (BID) | ORAL | 0 refills | Status: DC

## 2019-11-02 NOTE — Addendum Note (Signed)
Addended by: Catalina Antigua on: 11/02/2019 01:03 PM   Modules accepted: Orders

## 2019-11-05 ENCOUNTER — Telehealth: Payer: Self-pay

## 2019-11-05 DIAGNOSIS — B9689 Other specified bacterial agents as the cause of diseases classified elsewhere: Secondary | ICD-10-CM

## 2019-11-05 MED ORDER — METRONIDAZOLE 500 MG PO TABS: 500.0000 mg | ORAL_TABLET | Freq: Two times a day (BID) | ORAL | 0 refills | Status: AC

## 2019-11-05 NOTE — Telephone Encounter (Signed)
Rx for Flagyl sent to wrong pharmacy. Old Rx cancelled and new Rx sent to the correct pharmacy.

## 2020-01-08 ENCOUNTER — Other Ambulatory Visit (HOSPITAL_COMMUNITY)
Admission: RE | Admit: 2020-01-08 | Discharge: 2020-01-08 | Disposition: A | Discharge status: Home / Self Care | Payer: Medicare Other | Source: Ambulatory Visit

## 2020-01-08 ENCOUNTER — Ambulatory Visit (INDEPENDENT_AMBULATORY_CARE_PROVIDER_SITE_OTHER): Payer: Medicare Other

## 2020-01-08 ENCOUNTER — Other Ambulatory Visit: Payer: Self-pay

## 2020-01-08 VITALS — BP 112/69 | HR 82 | Ht 64.0 in | Wt 128.0 lb

## 2020-01-08 DIAGNOSIS — N898 Other specified noninflammatory disorders of vagina: Secondary | ICD-10-CM

## 2020-01-08 DIAGNOSIS — Z113 Encounter for screening for infections with a predominantly sexual mode of transmission: Secondary | ICD-10-CM

## 2020-01-08 NOTE — Progress Notes (Signed)
Pt c/o BV symptoms

## 2020-01-08 NOTE — Progress Notes (Signed)
History:  [name] is a 29 y.o. [phone] who presents to clinic today for STD testing. She thinks she has BV. She reports "BV symptoms and discomfort." She declines bloodwork for STDs   The following portions of the patient's history were reviewed and updated as appropriate: allergies, current medications, family history, past medical history, social history, past surgical history and problem list.  Review of Systems:  Review of Systems  Constitutional: Negative.  Negative for chills and fever.  Respiratory: Negative.   Cardiovascular: Negative.  Negative for chest pain.  Genitourinary: Negative.   Neurological: Negative.  Negative for dizziness and headaches.      Objective:  Physical Exam BP 112/69   Pulse 82   Ht 5\' 4"  (1.626 m)   Wt 128 lb (58.1 kg)   LMP 12/25/2019 (Approximate)   BMI 21.97 kg/m  Physical Exam Vitals and nursing note reviewed.  Constitutional:      General: She is not in acute distress.    Appearance: She is well-developed.  HENT:     Head: Normocephalic.  Eyes:     Pupils: Pupils are equal, round, and reactive to light.  Cardiovascular:     Rate and Rhythm: Normal rate and regular rhythm.  Pulmonary:     Effort: Pulmonary effort is normal. No respiratory distress.     Breath sounds: Normal breath sounds.  Abdominal:     Palpations: Abdomen is soft.     Tenderness: There is no abdominal tenderness.  Musculoskeletal:        General: Normal range of motion.     Cervical back: Normal range of motion.  Skin:    General: Skin is warm and dry.  Neurological:     Mental Status: She is alert and oriented to person, place, and time.  Psychiatric:        Behavior: Behavior normal.        Thought Content: Thought content normal.        Judgment: Judgment normal.    Patient declined pelvic exam and self swabbed.  Assessment & Plan:  1. Screening examination for STD (sexually transmitted disease) -Will call patient with results and treat  accordingly.  2. Vaginal discharge - [facility]   [facility], Rolm Bookbinder 01/08/2020 10:40 AM

## 2020-01-09 LAB — CERVICOVAGINAL ANCILLARY ONLY
Bacterial Vaginitis (gardnerella): POSITIVE — AB
Candida Glabrata: NEGATIVE
Candida Vaginitis: NEGATIVE
Chlamydia: NEGATIVE
Comment: NEGATIVE
Comment: NEGATIVE
Comment: NEGATIVE
Comment: NEGATIVE
Comment: NEGATIVE
Comment: NORMAL
Neisseria Gonorrhea: NEGATIVE
Trichomonas: NEGATIVE

## 2020-01-10 ENCOUNTER — Telehealth: Payer: Self-pay

## 2020-01-10 DIAGNOSIS — N760 Acute vaginitis: Secondary | ICD-10-CM

## 2020-01-10 MED ORDER — METRONIDAZOLE 500 MG PO TABS: 500.0000 mg | ORAL_TABLET | Freq: Two times a day (BID) | ORAL | 0 refills | Status: AC

## 2020-01-10 NOTE — Telephone Encounter (Signed)
Pt was notified via MyChart message of positive BV results. Flagyl sent to pharmacy.

## 2020-01-16 ENCOUNTER — Other Ambulatory Visit: Payer: Medicare Other | Admitting: *Deleted

## 2020-01-16 ENCOUNTER — Other Ambulatory Visit: Payer: Self-pay

## 2020-01-16 DIAGNOSIS — Z3200 Encounter for pregnancy test, result unknown: Secondary | ICD-10-CM

## 2020-01-16 NOTE — Progress Notes (Signed)
Pt is here for lab only BHCG.  She states her LMP was 12/22/19 and is requesting a blood test.  She states that she had a positive UPT @ home.

## 2020-01-23 ENCOUNTER — Other Ambulatory Visit: Payer: Self-pay | Admitting: Obstetrics and Gynecology

## 2020-01-23 ENCOUNTER — Telehealth: Payer: Self-pay | Admitting: Obstetrics and Gynecology

## 2020-01-23 NOTE — Telephone Encounter (Signed)
Lydia Cochran called the office today requesting an Rx for progesterone. She states she has had several positive home pregnancy tests.   Stat beta placed, patient to go to [facility] lab for draw. I will call patient with results. Patient is agreeable to plan of care.   Duane Lope, NP 01/23/2020 2:31 PM

## 2020-01-24 ENCOUNTER — Other Ambulatory Visit: Payer: Self-pay

## 2020-01-24 DIAGNOSIS — Z3200 Encounter for pregnancy test, result unknown: Secondary | ICD-10-CM

## 2020-01-24 NOTE — Progress Notes (Signed)
Pt here for STAT B-HCG per Venia Carbon, NP

## 2020-01-28 ENCOUNTER — Telehealth: Payer: Self-pay | Admitting: Obstetrics and Gynecology

## 2020-01-28 NOTE — Telephone Encounter (Signed)
Returned patient call at [employer] request (she left VM on office line).   Pt states she is pregnant and demanded to be prescribed the abortion pill. States she has 3 positive pregnancies tests at home and will come in and do "the pee test" but refuses to do a blood test due to anemia and cost. Demands I send the "abortion pill" to the pharmacy because she cannot risk her life over another pregnancy. When I stated that she would need to come in for pregnancy verification via blood test, she states she will come in but will not pay for it. When I stated we do not prescribe the "abortion pill" out of this office, she became very irate and demanded to know who I would send her to. I stated we would recommend [facility] and she states that she will not due to cost and "have you ever had an abortion and had to pay for it? Have you ever had an abortion ma'am?". When I reiterated that [facility] is where we would refer her, patient hung up phone.   Pt very short, rude, and belligerent throughout entire encounter, cussing at me and attempting to dictate care.   Baldemar Lenis, M.D. Attending Center for [employer] [facility])

## 2020-01-31 ENCOUNTER — Encounter: Payer: Self-pay | Admitting: *Deleted

## 2020-01-31 NOTE — Telephone Encounter (Addendum)
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  Certified letter mailed (tracking [phone]) to patient regarding dismissal.

## 2020-03-05 ENCOUNTER — Encounter: Payer: Medicare Other | Admitting: Obstetrics and Gynecology
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
## 2021-07-19 ENCOUNTER — Other Ambulatory Visit: Payer: Self-pay

## 2021-07-19 DIAGNOSIS — B9689 Other specified bacterial agents as the cause of diseases classified elsewhere: Secondary | ICD-10-CM

## 2021-07-19 DIAGNOSIS — N760 Acute vaginitis: Secondary | ICD-10-CM
